# Patient Record
Sex: Male | Born: 1980 | Race: Black or African American | Hispanic: No | Marital: Single | Smoking: Never smoker
Health system: Southern US, Community
[De-identification: ages and names within clinical notes are randomized; demographics above are authoritative.]

## PROBLEM LIST (undated history)

## (undated) ENCOUNTER — Emergency Department (HOSPITAL_COMMUNITY): Admission: EM | Payer: BC Managed Care – PPO | Source: Home / Self Care

---
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
## 2002-02-11 ENCOUNTER — Emergency Department (HOSPITAL_COMMUNITY): Admission: EM | Admit: 2002-02-11 | Discharge: 2002-02-11 | Payer: Self-pay | Admitting: Emergency Medicine

## 2005-01-02 ENCOUNTER — Emergency Department (HOSPITAL_COMMUNITY): Admission: EM | Admit: 2005-01-02 | Discharge: 2005-01-02 | Payer: Self-pay | Admitting: Emergency Medicine

## 2005-10-25 ENCOUNTER — Emergency Department (HOSPITAL_COMMUNITY): Admission: EM | Admit: 2005-10-25 | Discharge: 2005-10-25 | Payer: Self-pay | Admitting: Family Medicine

## 2006-06-02 ENCOUNTER — Emergency Department (HOSPITAL_COMMUNITY): Admission: EM | Admit: 2006-06-02 | Discharge: 2006-06-02 | Payer: Self-pay | Admitting: Emergency Medicine

## 2007-10-20 ENCOUNTER — Emergency Department (HOSPITAL_COMMUNITY): Admission: EM | Admit: 2007-10-20 | Discharge: 2007-10-20 | Payer: Self-pay | Admitting: Emergency Medicine

## 2009-04-02 ENCOUNTER — Emergency Department (HOSPITAL_COMMUNITY): Admission: EM | Admit: 2009-04-02 | Discharge: 2009-04-02 | Payer: Self-pay | Admitting: Family Medicine

## 2010-11-24 ENCOUNTER — Inpatient Hospital Stay (INDEPENDENT_AMBULATORY_CARE_PROVIDER_SITE_OTHER)
Admission: RE | Admit: 2010-11-24 | Discharge: 2010-11-24 | Disposition: A | Discharge status: Home / Self Care | Payer: Self-pay | Source: Ambulatory Visit | Attending: Family Medicine | Admitting: Family Medicine

## 2010-11-24 ENCOUNTER — Ambulatory Visit (INDEPENDENT_AMBULATORY_CARE_PROVIDER_SITE_OTHER): Payer: Self-pay

## 2010-11-24 DIAGNOSIS — J45909 Unspecified asthma, uncomplicated: Secondary | ICD-10-CM

## 2011-02-17 LAB — POCT RAPID STREP A: Streptococcus, Group A Screen (Direct): NEGATIVE

## 2011-04-08 ENCOUNTER — Emergency Department (HOSPITAL_COMMUNITY)
Admission: EM | Admit: 2011-04-08 | Discharge: 2011-04-08 | Disposition: A | Discharge status: Home / Self Care | Payer: Self-pay | Source: Home / Self Care

## 2011-04-08 ENCOUNTER — Emergency Department (INDEPENDENT_AMBULATORY_CARE_PROVIDER_SITE_OTHER): Payer: Self-pay

## 2011-04-08 ENCOUNTER — Encounter: Payer: Self-pay | Admitting: Emergency Medicine

## 2011-04-08 DIAGNOSIS — S93409A Sprain of unspecified ligament of unspecified ankle, initial encounter: Secondary | ICD-10-CM

## 2011-04-08 MED ORDER — IBUPROFEN 800 MG PO TABS: 800.0000 mg | ORAL_TABLET | Freq: Three times a day (TID) | ORAL | Status: AC

## 2011-04-08 NOTE — ED Notes (Signed)
Onset of pain was Monday--patient was at gym, playing basketball.  Player stepped on foot, heard pop.  Tried to use a few other gym pieces, but too painful.  Swelling to ankle/foot/toes. Pain is under lateral ankle, right foot.

## 2011-04-08 NOTE — ED Provider Notes (Signed)
History     CSN: [id number] Arrival date & time: 04/08/2011  6:43 PM   First MD Initiated Contact with Patient 04/08/11 1946      Chief Complaint  Patient presents with  . Ankle Pain    (Consider location/radiation/quality/duration/timing/severity/associated sxs/prior treatment) Patient is a 30 y.o. male presenting with ankle pain. The history is provided by the patient.  Ankle Pain  The incident occurred more than 2 days ago (was playing basket twisted foot in inverted position able to bear weight after). The incident occurred at the gym. The injury mechanism was torsion. The pain is present in the right ankle. The pain is at a severity of 6/10. The pain is moderate. The pain has been constant since onset. Associated symptoms include loss of motion. Pertinent negatives include no numbness, no inability to bear weight, no muscle weakness, no loss of sensation and no tingling. The symptoms are aggravated by bearing weight and palpation. He has tried acetaminophen for the symptoms. The treatment provided mild relief.    History reviewed. No pertinent past medical history.  Past Surgical History  Procedure Date  . Anterior cruciate ligament repair     History reviewed. No pertinent family history.  History  Substance Use Topics  . Smoking status: Never Smoker   . Smokeless tobacco: Not on file  . Alcohol Use: No      Review of Systems  Constitutional: Negative.   Cardiovascular: Negative.   Musculoskeletal: Positive for arthralgias.  Skin: Negative.   Neurological: Negative for tingling and numbness.    Allergies  Morphine and related  Home Medications   Current Outpatient Rx  Name Route Sig Dispense Refill  . IBUPROFEN 800 MG PO TABS Oral Take 1 tablet (800 mg total) by mouth 3 (three) times daily. 21 tablet 0    Take with food    BP 134/84  Pulse 69  Temp(Src) 98.2 F (36.8 C) (Oral)  Resp 18  SpO2 100%  Physical Exam  Nursing note and vitals  reviewed. Constitutional: He is oriented to person, place, and time. He appears well-developed and well-nourished. No distress.  HENT:  Head: Normocephalic and atraumatic.  Neck: Normal range of motion.  Cardiovascular: Normal rate, regular rhythm and normal heart sounds.   Pulmonary/Chest: Breath sounds normal.  Musculoskeletal:       Right ankle: He exhibits swelling. He exhibits no ecchymosis, no deformity, no laceration and normal pulse. tenderness. Lateral malleolus tenderness found. No medial malleolus and no proximal fibula tenderness found. Achilles tendon normal.  Neurological: He is alert and oriented to person, place, and time.    ED Course  Procedures (including critical care time)  Labs Reviewed - No data to display Dg Foot Complete Right  04/08/2011  *RADIOLOGY REPORT*  Clinical Data: Right foot pain and swelling.  RIGHT FOOT COMPLETE - 3+ VIEW  Comparison: None  Findings: The joint spaces are maintained.  No acute fracture.  IMPRESSION: No acute bony findings.  Original Report Authenticated By: P. Loralie Champagne, M.D.     1. Ankle sprain       MDM  Placed ankle ASO brace,  RICE and antiinflammatory medication encouraged to start rehab exercises as soon as pain improves.Sharin Grave, MD 04/09/11 1226

## 2011-05-05 ENCOUNTER — Emergency Department (INDEPENDENT_AMBULATORY_CARE_PROVIDER_SITE_OTHER)
Admission: EM | Admit: 2011-05-05 | Discharge: 2011-05-05 | Disposition: A | Discharge status: Home / Self Care | Payer: Self-pay | Source: Home / Self Care | Attending: Family Medicine | Admitting: Family Medicine

## 2011-05-05 ENCOUNTER — Encounter (HOSPITAL_COMMUNITY): Payer: Self-pay

## 2011-05-05 DIAGNOSIS — H6980 Other specified disorders of Eustachian tube, unspecified ear: Secondary | ICD-10-CM

## 2011-05-05 DIAGNOSIS — H6990 Unspecified Eustachian tube disorder, unspecified ear: Secondary | ICD-10-CM

## 2011-05-05 MED ORDER — AMOXICILLIN 500 MG PO CAPS: 500.0000 mg | ORAL_CAPSULE | Freq: Three times a day (TID) | ORAL | Status: AC

## 2011-05-05 MED ORDER — FLUTICASONE PROPIONATE 50 MCG/ACT NA SUSP: 2.0000 | Freq: Every day | NASAL | Status: DC

## 2011-05-05 NOTE — ED Notes (Signed)
C/o sore throat, soreness to neck bilaterally and nasal congestion for 2 days.  Denies known fever or cough.

## 2011-05-05 NOTE — ED Provider Notes (Signed)
History     CSN: [id number] Arrival date & time: 05/05/2011  8:57 AM   First MD Initiated Contact with Patient 05/05/11 0913      Chief Complaint  Patient presents with  . Sore Throat    (Consider location/radiation/quality/duration/timing/severity/associated sxs/prior treatment) HPI Comments: Ledon presents for evaluation of pain beneath both ears, neck pain with turning his head. He denies any fever or cough. He does report a cracked tooth and has a dentist appointment in 2 weeks.   Patient is a 30 y.o. male presenting with pharyngitis. The history is provided by the patient.  Sore Throat This is a new problem. The current episode started more than 2 days ago. The problem has not changed since onset.   Past Medical History  Diagnosis Date  . Asthma     Past Surgical History  Procedure Date  . Anterior cruciate ligament repair     No family history on file.  History  Substance Use Topics  . Smoking status: Never Smoker   . Smokeless tobacco: Not on file  . Alcohol Use: No      Review of Systems  Constitutional: Negative.   HENT: Positive for congestion, sore throat and neck pain. Negative for ear pain, trouble swallowing and ear discharge.   Eyes: Negative.   Respiratory: Negative.   Cardiovascular: Negative.   Gastrointestinal: Negative.   Genitourinary: Negative.   Skin: Negative.   Neurological: Negative.     Allergies  Morphine and related  Home Medications   Current Outpatient Rx  Name Route Sig Dispense Refill  . AMOXICILLIN 500 MG PO CAPS Oral Take 1 capsule (500 mg total) by mouth 3 (three) times daily. 30 capsule 0  . FLUTICASONE PROPIONATE 50 MCG/ACT NA SUSP Nasal Place 2 sprays into the nose daily. 16 g 2    BP 127/85  Pulse 70  Temp(Src) 98.2 F (36.8 C) (Oral)  Resp 20  SpO2 100%  Physical Exam  Nursing note and vitals reviewed. Constitutional: He is oriented to person, place, and time. He appears well-developed and  well-nourished.  HENT:  Head: Normocephalic and atraumatic.  Right Ear: Tympanic membrane is retracted.  Left Ear: Tympanic membrane is retracted.  Mouth/Throat: Uvula is midline, oropharynx is clear and moist and mucous membranes are normal.  Eyes: EOM are normal.  Neck: Normal range of motion. Neck supple. Muscular tenderness present. No spinous process tenderness present. Normal range of motion present.    Pulmonary/Chest: Effort normal.  Musculoskeletal: Normal range of motion.  Neurological: He is alert and oriented to person, place, and time.  Skin: Skin is warm and dry.  Psychiatric: His behavior is normal.    ED Course  Procedures (including critical care time)   Labs Reviewed  POCT RAPID STREP A ([facility] ONLY)   No results found.   1. Eustachian tube dysfunction       MDM  Labs reviewed        Richardo Priest, MD 05/05/11 1030

## 2012-03-12 ENCOUNTER — Emergency Department (HOSPITAL_COMMUNITY)
Admission: EM | Admit: 2012-03-12 | Discharge: 2012-03-12 | Disposition: A | Discharge status: Home / Self Care | Payer: Self-pay | Attending: Emergency Medicine | Admitting: Emergency Medicine

## 2012-03-12 ENCOUNTER — Emergency Department (HOSPITAL_COMMUNITY): Payer: Self-pay

## 2012-03-12 DIAGNOSIS — T148XXA Other injury of unspecified body region, initial encounter: Secondary | ICD-10-CM | POA: Insufficient documentation

## 2012-03-12 DIAGNOSIS — Y939 Activity, unspecified: Secondary | ICD-10-CM | POA: Insufficient documentation

## 2012-03-12 DIAGNOSIS — X500XXA Overexertion from strenuous movement or load, initial encounter: Secondary | ICD-10-CM | POA: Insufficient documentation

## 2012-03-12 MED ORDER — KETOROLAC TROMETHAMINE 30 MG/ML IJ SOLN
30.0000 mg | Freq: Once | INTRAMUSCULAR | Status: AC
  Administered 2012-03-12: 30 mg via INTRAMUSCULAR
  Filled 2012-03-12: qty 1

## 2012-03-12 MED ORDER — IBUPROFEN 600 MG PO TABS: 600.0000 mg | ORAL_TABLET | Freq: Four times a day (QID) | ORAL | Status: DC | PRN

## 2012-03-12 NOTE — ED Provider Notes (Signed)
History     CSN: [id number]  Arrival date & time 03/12/12  2051   First MD Initiated Contact with Patient 03/12/12 2112      Chief Complaint  Patient presents with  . 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  On the right.  No numbness or tingling distally, full ROM  to the elbow, wrist , fingers   Patient is a 31 y.o. male presenting with shoulder injury. The history is provided by the patient.  Shoulder Injury This is a new problem. The current episode started today. The problem occurs constantly. The problem has been unchanged. Associated symptoms include joint swelling. Pertinent negatives include no chills, fever, numbness or weakness.    Past Medical History  Diagnosis Date  . Asthma     Past Surgical History  Procedure Date  . Anterior cruciate ligament repair     No family history on file.  History  Substance Use Topics  . Smoking status: Never Smoker   . Smokeless tobacco: Not on file  . Alcohol Use: No      Review of Systems  Constitutional: Negative for fever and chills.  Musculoskeletal: Positive for joint swelling.  Neurological: Negative for dizziness, weakness and numbness.    Allergies  Morphine and related  Home Medications   Current Outpatient Rx  Name Route Sig Dispense Refill  . IBUPROFEN 200 MG PO TABS Oral Take 800 mg by mouth every 6 (six) hours as needed. pain    . IBUPROFEN 600 MG PO TABS Oral Take 1 tablet (600 mg total) by mouth every 6 (six) hours as needed for pain. 30 tablet 0    BP 136/93  Pulse 54  Temp 98.4 F (36.9 C) (Oral)  Resp 17  SpO2 100%  Physical Exam  Constitutional: He is oriented to person, place,  and time. He appears well-developed and well-nourished.  Neck: Normal range of motion.  Cardiovascular: Normal rate.   Pulmonary/Chest: Effort normal.  Musculoskeletal: He exhibits tenderness. He exhibits no edema.       Arms: Neurological: He is alert and oriented to person, place, and time.  Skin: Skin is warm. No erythema.    ED Course  Procedures (including critical care time)  Labs Reviewed - No data to display Dg Shoulder Right  03/12/2012  *RADIOLOGY REPORT*  Clinical Data: Right shoulder pain.  Limited range of motion.  RIGHT SHOULDER - 2+ VIEW  Comparison: None.  Findings: There is no fracture, dislocation, or other abnormality.  IMPRESSION: Normal exam.   Original Report Authenticated By: Gwynn Burly, M.D.      1. Muscle strain       MDM   Severe.  Muscle strain.  Will limit physical activity.  For one week and gradually add back to sit-ups and flexibility.  Over the following 2 weeks as pain allows.  Referred to orthopedics for further evaluation.  If pain, and limited mobility persists        Arman Filter, NP 03/12/12 2201  Arman Filter, NP 03/12/12 6962  Arman Filter, NP 03/12/12 2201

## 2012-03-12 NOTE — ED Notes (Signed)
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 Pt states ROM intact, but painful. Pt states he has been taking Motrin, but it's not helping the pain.

## 2012-03-13 NOTE — ED Provider Notes (Signed)
Medical screening examination/treatment/procedure(s) were conducted as a shared visit with non-physician practitioner(s) and myself.  I personally evaluated the patient during the encounter. Shoulder pain during training for police. Reassuring x-ray. Patient discharged home.  Juliet Rude. Rubin Payor, MD 03/13/12 4098

## 2013-07-29 ENCOUNTER — Emergency Department (HOSPITAL_COMMUNITY)
Admission: EM | Admit: 2013-07-29 | Discharge: 2013-07-29 | Disposition: A | Discharge status: Home / Self Care | Payer: BC Managed Care – PPO | Source: Home / Self Care | Attending: Emergency Medicine | Admitting: Emergency Medicine

## 2013-07-29 ENCOUNTER — Encounter (HOSPITAL_COMMUNITY): Payer: Self-pay | Admitting: Emergency Medicine

## 2013-07-29 DIAGNOSIS — R69 Illness, unspecified: Principal | ICD-10-CM

## 2013-07-29 DIAGNOSIS — J111 Influenza due to unidentified influenza virus with other respiratory manifestations: Secondary | ICD-10-CM

## 2013-07-29 MED ORDER — OSELTAMIVIR PHOSPHATE 75 MG PO CAPS: 75.0000 mg | ORAL_CAPSULE | Freq: Two times a day (BID) | ORAL | Status: DC

## 2013-07-29 NOTE — Discharge Instructions (Signed)
Most upper respiratory infections are caused by viruses and do not require antibiotics.  We try to save the antibiotics for when we really need them to prevent bacteria from developing resistance to them.  Here are a few hints about things that can be done at home to help get over an upper respiratory infection quicker: ° °Get extra sleep and extra fluids.  Get 7 to 9 hours of sleep per night and 6 to 8 glasses of water a day.  Getting extra sleep keeps the immune system from getting run down.  Most people with an upper respiratory infection are a little dehydrated.  The extra fluids also keep the secretions liquified and easier to deal with.  Also, get extra vitamin C.  4000 mg per day is the recommended dose. °For the aches, headache, and fever, acetaminophen or ibuprofen are helpful.  These can be alternated every 4 hours.  People with liver disease should avoid large amounts of acetaminophen, and people with ulcer disease, gastroesophageal reflux, gastritis, congestive heart failure, chronic kidney disease, coronary artery disease and the elderly should avoid ibuprofen. °For nasal congestion try Mucinex-D, or if you're having lots of sneezing or clear nasal drainage use Zyrtec-D. People with high blood pressure can take these if their blood pressure is controlled, if not, it's best to avoid the forms with a "D" (decongestants).  You can use the plain Mucinex, Allegra, Claritin, or Zyrtec even if your blood pressure is not controlled.   °A Saline nasal spray such as Ocean Spray can also help.  You can add a decongestant sprays such as Afrin, but you should not use the decongestant sprays for more than 3 or 4 days since they can be habituating.  Breathe Rite nasal strips can also offer a non-drug alternative treatment to nasal congestion, especially at night. °For people with symptoms of sinusitis, sleeping with your head elevated can be helpful.  For sinus pain, moist, hot compresses to the face may provide some  relief.  Many people find that inhaling steam as in a shower or from a pot of steaming water can help. °For any viral infection, zinc containing lozenges such as Cold-Eze or Zicam are helpful.  Zinc helps to fight viral infection.  Hot salt water gargles (8 oz of hot water, 1/2 tsp of table salt, and a pinch of baking soda) can give relief as well as hot beverages such as hot tea.  Sucrets extra strength lozenges will help the sore throat.  °For the cough, take Delsym 2 tsp every 12 hours.  It has also been found recently that Aleve can help control a cough.  The dose is 1 to 2 tablets twice daily with food.  This can be combined with Delsym. (Note, if you are taking ibuprofen, you should not take Aleve as well--take one or the other.) °A cool mist vaporizer will help keep your mucous membranes from drying out.  ° °It's important when you have an upper respiratory infection not to pass the infection to others.  This involves being very careful about the following: ° °Frequent hand washing or use of hand sanitizer, especially after coughing, sneezing, blowing your nose or touching your face, nose or eyes. °Do not shake hands or touch anyone and try to avoid touching surfaces that other people use such as doorknobs, shopping carts, telephones and computer keyboards. °Use tissues and dispose of them properly in a garbage can or ziplock bag. °Cough into your sleeve. °Do not let others eat or   drink after you. ° °It's also important to recognize the signs of serious illness and get evaluated if they occur: °Any respiratory infection that lasts more than 7 to 10 days.  Yellow nasal drainage and sputum are not reliable indicators of a bacterial infection, but if they last for more than 1 week, see your doctor. °Fever and sore throat can indicate strep. °Fever and cough can indicate influenza or pneumonia. °Any kind of severe symptom such as difficulty breathing, intractable vomiting, or severe pain should prompt you to see  a doctor as soon as possible. ° ° °Your body's immune system is really the thing that will get rid of this infection.  Your immune system is comprised of 2 types of specialized cells called T cells and B cells.  T cells coordinate the array of cells in your body that engulf invading bacteria or viruses while B cells orchestrate the production of antibodies that neutralize infection.  Anything we do or any medications we give you, will just strengthen your immune system or help it clear up the infection quicker.  Here are a few helpful hints to improve your immune system to help overcome this illness or to prevent future infections: °· A few vitamins can improve the health of your immune system.  That's why your diet should include plenty of fruits, vegetables, fish, nuts, and whole grains. °· Vitamin A and bet-carotene can increase the cells that fight infections (T cells and B cells).  Vitamin A is abundant in dark greens and orange vegetables such as spinach, greens, sweet potatoes, and carrots. °· Vitamin B6 contributes to the maturation of white blood cells, the cells that fight disease.  Foods with vitamin B6 include cold cereal and bananas. °· Vitamin C is credited with preventing colds because it increases white blood cells and also prevents cellular damage.  Citrus fruits, peaches and green and red bell peppers are all hight in vitamin C. °· Vitamin E is an anti-oxidant that encourages the production of natural killer cells which reject foreign invaders and B cells that produce antibodies.  Foods high in vitamin E include wheat germ, nuts and seeds. °· Foods high in omega-3 fatty acids found in foods like salmon, tuna and mackerel boost your immune system and help cells to engulf and absorb germs. °· Probiotics are good bacteria that increase your T cells.  These can be found in yogurt and are available in supplements such as Culturelle or Align. °· Moderate exercise increases the strength of your immune  system and your ability to recover from illness.  I suggest 3 to 5 moderate intensity 30 minute workouts per week.   °· Sleep is another component of maintaining a strong immune system.  It enables your body to recuperate from the day's activities, stress and work.  My recommendation is to get between 7 and 9 hours of sleep per night. °· If you smoke, try to quit completely or at least cut down.  Drink alcohol only in moderation if at all.  No more than 2 drinks daily for men or 1 for women. °· Get a flu vaccine early in the fall or if you have not gotten one yet, once this illness has run its course.  If you are over 65, a smoker, or an asthmatic, get a pneumococcal vaccine. °· My final recommendation is to maintain a healthy weight.  Excess weight can impair the immune system by interfering with the way the immune system deals with invading viruses or   bacteria. ° ° ° °Influenza, Adult °Influenza ("the flu") is a viral infection of the respiratory tract. It occurs more often in winter months because people spend more time in close contact with one another. Influenza can make you feel very sick. Influenza easily spreads from person to person (contagious). °CAUSES  °Influenza is caused by a virus that infects the respiratory tract. You can catch the virus by breathing in droplets from an infected person's cough or sneeze. You can also catch the virus by touching something that was recently contaminated with the virus and then touching your mouth, nose, or eyes. °SYMPTOMS  °Symptoms typically last 4 to 10 days and may include: °· Fever. °· Chills. °· Headache, body aches, and muscle aches. °· Sore throat. °· Chest discomfort and cough. °· Poor appetite. °· Weakness or feeling tired. °· Dizziness. °· Nausea or vomiting. °DIAGNOSIS  °Diagnosis of influenza is often made based on your history and a physical exam. A nose or throat swab test can be done to confirm the diagnosis. °RISKS AND COMPLICATIONS °You may be at risk  for a more severe case of influenza if you smoke cigarettes, have diabetes, have chronic heart disease (such as heart failure) or lung disease (such as asthma), or if you have a weakened immune system. Elderly people and pregnant women are also at risk for more serious infections. The most common complication of influenza is a lung infection (pneumonia). Sometimes, this complication can require emergency medical care and may be life-threatening. °PREVENTION  °An annual influenza vaccination (flu shot) is the best way to avoid getting influenza. An annual flu shot is now routinely recommended for all adults in the [place] °TREATMENT  °In mild cases, influenza goes away on its own. Treatment is directed at relieving symptoms. For more severe cases, your caregiver may prescribe antiviral medicines to shorten the sickness. Antibiotic medicines are not effective, because the infection is caused by a virus, not by bacteria. °HOME CARE INSTRUCTIONS °· Only take over-the-counter or prescription medicines for pain, discomfort, or fever as directed by your caregiver. °· Use a cool mist humidifier to make breathing easier. °· Get plenty of rest until your temperature returns to normal. This usually takes 3 to 4 days. °· Drink enough fluids to keep your urine clear or pale yellow. °· Cover your mouth and nose when coughing or sneezing, and wash your hands well to avoid spreading the virus. °· Stay home from work or school until your fever has been gone for at least 1 full day. °SEEK MEDICAL CARE IF:  °· You have chest pain or a deep cough that worsens or produces more mucus. °· You have nausea, vomiting, or diarrhea. °SEEK IMMEDIATE MEDICAL CARE IF:  °· You have difficulty breathing, shortness of breath, or your skin or nails turn bluish. °· You have severe neck pain or stiffness. °· You have a severe headache, facial pain, or earache. °· You have a worsening or recurring fever. °· You have nausea or vomiting that cannot be  controlled. °MAKE SURE YOU: °· Understand these instructions. °· Will watch your condition. °· Will get help right away if you are not doing well or get worse. °Document Released: 05/07/2000 Document Revised: 11/09/2011 Document Reviewed: 08/09/2011 °[employer]® Patient Information ©2014 [employer]. ° °

## 2013-07-29 NOTE — ED Notes (Signed)
C/o URI type symptoms ; minimal relief w OTC medications; NAD

## 2013-07-29 NOTE — ED Provider Notes (Signed)
  Chief Complaint   Chief Complaint  Patient presents with  . URI    History of Present Illness   Jeffery Jordan is a 33 year old male who has had a three-day history of temperature of up to 100.1, sweats, fatigue, nasal congestion with clear drainage, headache, sinus pressure, right ear congestion, sore throat, and a cough productive of small amounts of sputum. He denies any wheezing, chest pain, or GI symptoms. He's been exposed to his wife who has had a similar respiratory illness and she is a Chart[employer] loss adjusterschoolteacher.  Review of Systems   Other than as noted above, the patient denies any of the following symptoms: Systemic:  No fevers, chills, sweats, or myalgias. Eye:  No redness or discharge. ENT:  No ear pain, headache, nasal congestion, drainage, sinus pressure, or sore throat. Neck:  No neck pain, stiffness, or swollen glands. Lungs:  No cough, sputum production, hemoptysis, wheezing, chest tightness, shortness of breath or chest pain. GI:  No abdominal pain, nausea, vomiting or diarrhea.  PMFSH   Past medical history, family history, social history, meds, and allergies were reviewed.   Physical exam   Vital signs:  BP 148/95  Pulse 50  Temp(Src) 98.1 F (36.7 C)  Resp 18  SpO2 100% General:  Alert and oriented.  In no distress.  Skin warm and dry. Eye:  No conjunctival injection or drainage. Lids were normal. ENT:  TMs and canals were normal, without erythema or inflammation.  Nasal mucosa was clear and uncongested, without drainage.  Mucous membranes were moist.  Pharynx was clear with no exudate or drainage.  There were no oral ulcerations or lesions. Neck:  Supple, no adenopathy, tenderness or mass. Lungs:  No respiratory distress.  Lungs were clear to auscultation, without wheezes, rales or rhonchi.  Breath sounds were clear and equal bilaterally.  Heart:  Regular rhythm, without gallops, murmers or rubs. Skin:  Clear, warm, and dry, without rash or lesions.  Labs    Results for orders placed during the hospital encounter of 05/05/11  POCT RAPID STREP A ([facility] ONLY)      Result Value Ref Range   Streptococcus, Group A Screen (Direct) NEGATIVE  NEGATIVE    Assessment     The encounter diagnosis was Influenza-like illness.  Plan    1.  Meds:  The following meds were prescribed:   New Prescriptions   OSELTAMIVIR (TAMIFLU) 75 MG CAPSULE    Take 1 capsule (75 mg total) by mouth every 12 (twelve) hours.    2.  Patient Education/Counseling:  The patient was given appropriate handouts, self care instructions, and instructed in symptomatic relief.  Instructed to get extra fluids, rest, and use a cool mist vaporizer.    3.  Follow up:  The patient was told to follow up here if no better in 3 to 4 days, or sooner if becoming worse in any way, and given some red flag symptoms such as increasing fever, difficulty breathing, chest pain, or persistent vomiting which would prompt immediate return.  Follow up here as needed.      Reuben Likesavid C Melek Pownall, MD 07/29/13 1136

## 2013-11-16 ENCOUNTER — Emergency Department (HOSPITAL_COMMUNITY)
Admission: EM | Admit: 2013-11-16 | Discharge: 2013-11-16 | Disposition: A | Discharge status: Home / Self Care | Payer: BC Managed Care – PPO | Attending: Emergency Medicine | Admitting: Emergency Medicine

## 2013-11-16 ENCOUNTER — Encounter (HOSPITAL_COMMUNITY): Payer: Self-pay | Admitting: Emergency Medicine

## 2013-11-16 DIAGNOSIS — J45909 Unspecified asthma, uncomplicated: Secondary | ICD-10-CM | POA: Insufficient documentation

## 2013-11-16 DIAGNOSIS — J069 Acute upper respiratory infection, unspecified: Secondary | ICD-10-CM

## 2013-11-16 DIAGNOSIS — R509 Fever, unspecified: Secondary | ICD-10-CM | POA: Insufficient documentation

## 2013-11-16 DIAGNOSIS — Z79899 Other long term (current) drug therapy: Secondary | ICD-10-CM | POA: Insufficient documentation

## 2013-11-16 NOTE — ED Notes (Signed)
PA at bedside.

## 2013-11-16 NOTE — ED Notes (Addendum)
Patient c/o fever (100.1) at home yesterday, has not been feeling well the past couple days. Patient states he has not been able to sleep due to the sweats. Patient states he had R lymphnode swelling, and runny nose.

## 2013-11-16 NOTE — Discharge Instructions (Signed)
Continue to take Tylenol or Ibuprofen for any headache or fever.  Follow up with a primary care provider for continued evaluation and treatment.    Upper Respiratory Infection, Adult An upper respiratory infection (URI) is also known as the common cold. It is often caused by a type of germ (virus). Colds are easily spread (contagious). You can pass it to others by kissing, coughing, sneezing, or drinking out of the same glass. Usually, you get better in 1 or 2 weeks.  HOME CARE   Only take medicine as told by your doctor.  Use a warm mist humidifier or breathe in steam from a hot shower.  Drink enough water and fluids to keep your pee (urine) clear or pale yellow.  Get plenty of rest.  Return to work when your temperature is back to normal or as told by your doctor. You may use a face mask and wash your hands to stop your cold from spreading. GET HELP RIGHT AWAY IF:   After the first few days, you feel you are getting worse.  You have questions about your medicine.  You have chills, shortness of breath, or brown or red spit (mucus).  You have yellow or brown snot (nasal discharge) or pain in the face, especially when you bend forward.  You have a fever, puffy (swollen) neck, pain when you swallow, or white spots in the back of your throat.  You have a bad headache, ear pain, sinus pain, or chest pain.  You have a high-pitched whistling sound when you breathe in and out (wheezing).  You have a lasting cough or cough up blood.  You have sore muscles or a stiff neck. MAKE SURE YOU:   Understand these instructions.  Will watch your condition.  Will get help right away if you are not doing well or get worse. Document Released: 10/27/2007 Document Revised: 08/02/2011 Document Reviewed: 09/14/2010 [facility] Patient Information 2015 [place], [place]. This information is not intended to replace advice given to you by your health care provider. Make sure you discuss any questions you  have with your health care provider.

## 2013-11-16 NOTE — ED Provider Notes (Signed)
CSN: [id number]     Arrival date & time 11/16/13  [id number] History   First MD Initiated Contact with Patient 11/16/13 0505     Chief Complaint  Patient presents with  . Fever  . Nasal Congestion   HPI  History provided by the patient. Patient is a 33 year old male with past history of asthma presents with concerns for possible infection with symptoms of fever, cough and congestion. He reports having 3-4 days of cough and congestion. He also reports having a fever of 100 yesterday. Last night he broke out into sweats but states he has been feeling much better today. He states he thinks maybe he is improving from infection. Denies any known sick contacts. 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 Denies any chest pain or shortness of breath. No episodes of vomiting or diarrhea. No other aggravating or alleviating factors. No other associated symptoms.   Past Medical History  Diagnosis Date  . Asthma    Past Surgical History  Procedure Laterality Date  . Anterior cruciate ligament repair     No family history on file. History  Substance Use Topics  . Smoking status: Never Smoker   . Smokeless tobacco: Not on file  . Alcohol Use: No    Review of Systems  Constitutional: Positive for fever, chills and diaphoresis. Negative for appetite change.  HENT: Positive for congestion and rhinorrhea. Negative for sore throat.   Respiratory: Positive for cough.   Gastrointestinal: Negative for vomiting, abdominal pain and diarrhea.  All other systems reviewed and are negative.     Allergies  Morphine and related  Home Medications   Prior to Admission medications   Medication Sig Start Date End Date Taking? Authorizing Provider  ibuprofen (ADVIL,MOTRIN) 200 MG tablet Take 800 mg by mouth every 6 (six) hours as needed. pain    Historical Provider, MD  ibuprofen (ADVIL,MOTRIN) 600 MG tablet Take 1 tablet (600 mg total) by mouth every 6 (six) hours  as needed for pain. 03/12/12   Arman FilterGail K Schulz, NP  oseltamivir (TAMIFLU) 75 MG capsule Take 1 capsule (75 mg total) by mouth every 12 (twelve) hours. 07/29/13   Reuben Likesavid C Keller, MD   BP 134/104  Pulse 72  Temp(Src) 98.2 F (36.8 C) (Oral)  Resp 16  SpO2 100% Physical Exam  Nursing note and vitals reviewed. Constitutional: He is oriented to person, place, and time. He appears well-developed and well-nourished. No distress.  HENT:  Head: Normocephalic and atraumatic.  Right Ear: Tympanic membrane normal.  Left Ear: Tympanic membrane normal.  Mouth/Throat: Oropharynx is clear and moist.  Eyes: Conjunctivae are normal.  Neck: Normal range of motion. Neck supple.  No meningeal signs  Cardiovascular: Normal rate and regular rhythm.   Pulmonary/Chest: Effort normal and breath sounds normal. No respiratory distress. He has no wheezes. He has no rales.  Abdominal: Soft. There is no tenderness.  Neurological: He is alert and oriented to person, place, and time.  Skin: Skin is warm.  Psychiatric: He has a normal mood and affect. His behavior is normal.    ED Course  Procedures   COORDINATION OF CARE:  Nursing notes reviewed. Vital signs reviewed. Initial pt interview and examination performed.   Filed Vitals:   11/16/13 0347  BP: 134/104  Pulse: 72  Temp: 98.2 F (36.8 C)  TempSrc: Oral  Resp: 16  SpO2: 100%    5:05 AM-patient seen and evaluated. Patient is well-appearing in  no acute distress. He is afebrile here. His appears irregular toxic. No significant findings on examination. He does report some recent symptoms consistent with URI now seems to be improving. At this time I will recommend any over-the-counter treatment for any symptoms bothering him. He agrees with this plan.      MDM   Final diagnoses:  URI (upper respiratory infection)        Angus Sellereter S Imara Standiford, PA-C 11/16/13 [phone]

## 2013-11-18 NOTE — ED Provider Notes (Signed)
Medical screening examination/treatment/procedure(s) were performed by non-physician practitioner and as supervising physician I was immediately available for consultation/collaboration.   Candyce ChurnJohn David Wofford III, MD 11/18/13 [id number]

## 2014-01-22 HISTORY — PX: FOOT SURGERY: SHX648

## 2014-02-06 ENCOUNTER — Emergency Department (HOSPITAL_COMMUNITY)
Admission: EM | Admit: 2014-02-06 | Discharge: 2014-02-07 | Disposition: A | Discharge status: Home / Self Care | Payer: BC Managed Care – PPO | Attending: Emergency Medicine | Admitting: Emergency Medicine

## 2014-02-06 ENCOUNTER — Encounter (HOSPITAL_COMMUNITY): Payer: Self-pay | Admitting: Emergency Medicine

## 2014-02-06 ENCOUNTER — Emergency Department (HOSPITAL_COMMUNITY): Payer: BC Managed Care – PPO

## 2014-02-06 DIAGNOSIS — S91109A Unspecified open wound of unspecified toe(s) without damage to nail, initial encounter: Secondary | ICD-10-CM | POA: Insufficient documentation

## 2014-02-06 DIAGNOSIS — Z79899 Other long term (current) drug therapy: Secondary | ICD-10-CM | POA: Insufficient documentation

## 2014-02-06 DIAGNOSIS — S91309A Unspecified open wound, unspecified foot, initial encounter: Secondary | ICD-10-CM | POA: Diagnosis present

## 2014-02-06 DIAGNOSIS — J45909 Unspecified asthma, uncomplicated: Secondary | ICD-10-CM | POA: Diagnosis not present

## 2014-02-06 DIAGNOSIS — Y9389 Activity, other specified: Secondary | ICD-10-CM | POA: Diagnosis not present

## 2014-02-06 DIAGNOSIS — Z23 Encounter for immunization: Secondary | ICD-10-CM | POA: Diagnosis not present

## 2014-02-06 DIAGNOSIS — S91311A Laceration without foreign body, right foot, initial encounter: Secondary | ICD-10-CM

## 2014-02-06 DIAGNOSIS — W292XXA Contact with other powered household machinery, initial encounter: Secondary | ICD-10-CM | POA: Insufficient documentation

## 2014-02-06 DIAGNOSIS — Y9289 Other specified places as the place of occurrence of the external cause: Secondary | ICD-10-CM | POA: Insufficient documentation

## 2014-02-06 LAB — CBC WITH DIFFERENTIAL/PLATELET
Basophils Absolute: 0 10*3/uL (ref 0.0–0.1)
Basophils Relative: 0 % (ref 0–1)
EOS PCT: 2 % (ref 0–5)
Eosinophils Absolute: 0.1 10*3/uL (ref 0.0–0.7)
HEMATOCRIT: 39.8 % (ref 39.0–52.0)
Hemoglobin: 13.8 g/dL (ref 13.0–17.0)
LYMPHS ABS: 2.7 10*3/uL (ref 0.7–4.0)
LYMPHS PCT: 41 % (ref 12–46)
MCH: 31.1 pg (ref 26.0–34.0)
MCHC: 34.7 g/dL (ref 30.0–36.0)
MCV: 89.6 fL (ref 78.0–100.0)
Monocytes Absolute: 0.5 10*3/uL (ref 0.1–1.0)
Monocytes Relative: 8 % (ref 3–12)
NEUTROS ABS: 3.3 10*3/uL (ref 1.7–7.7)
Neutrophils Relative %: 49 % (ref 43–77)
PLATELETS: 232 10*3/uL (ref 150–400)
RBC: 4.44 MIL/uL (ref 4.22–5.81)
RDW: 12.8 % (ref 11.5–15.5)
WBC: 6.6 10*3/uL (ref 4.0–10.5)

## 2014-02-06 MED ORDER — MORPHINE SULFATE 4 MG/ML IJ SOLN
4.0000 mg | Freq: Once | INTRAMUSCULAR | Status: AC
  Administered 2014-02-06: 4 mg via INTRAVENOUS
  Filled 2014-02-06: qty 1

## 2014-02-06 NOTE — ED Provider Notes (Signed)
CSN: [id number]     Arrival date & time 02/06/14  2247 History   First MD Initiated Contact with Patient 02/06/14 2259     No chief complaint on file.    (Consider location/radiation/quality/duration/timing/severity/associated sxs/prior Treatment) HPI Comments: The patient is a 33 year old male presents emergency room chief complaint of laceration to right foot which occurred just prior to arrival. The patient reports he stepped on a food processor blade. Reports tetanus  > 5 years ago. Patient reports a large amount of bleeding into a trash can, multiple clots.  Patient is a 33 y.o. male presenting with skin laceration. The history is provided by the patient. No language interpreter was used.  Laceration   Past Medical History  Diagnosis Date  . Asthma    Past Surgical History  Procedure Laterality Date  . Anterior cruciate ligament repair     No family history on file. History  Substance Use Topics  . Smoking status: Never Smoker   . Smokeless tobacco: Not on file  . Alcohol Use: No    Review of Systems  Musculoskeletal: Positive for gait problem.  Skin: Positive for wound.  Neurological: Negative for numbness.      Allergies  Morphine and related  Home Medications   Prior to Admission medications   Medication Sig Start Date End Date Taking? Authorizing Provider  ibuprofen (ADVIL,MOTRIN) 200 MG tablet Take 800 mg by mouth every 6 (six) hours as needed. pain    Historical Provider, MD  ibuprofen (ADVIL,MOTRIN) 600 MG tablet Take 1 tablet (600 mg total) by mouth every 6 (six) hours as needed for pain. 03/12/12   Arman Filter, NP  oseltamivir (TAMIFLU) 75 MG capsule Take 1 capsule (75 mg total) by mouth every 12 (twelve) hours. 07/29/13   Reuben Likes, MD   BP 131/90  Pulse 61  Temp(Src) 98 F (36.7 C) (Oral)  Resp 20  SpO2 100% Physical Exam  Nursing note and vitals reviewed. Constitutional: He appears well-developed and well-nourished. No distress.  HENT:   Head: Normocephalic and atraumatic.  Neck: Neck supple.  Pulmonary/Chest: Effort normal. No respiratory distress.  Musculoskeletal:  Approximately 8 cm laceration between great toe and 2nd toe.  Extending from webbing to the sole of foot.  Moderate amount of blood, no pulsatile bleeding. Sensation intact, full ROM to toes. Right great toe with 2 CM circular laceration with small pedicle. Minimal bleeding. Sensation intact.  Skin: Skin is warm and dry. He is not diaphoretic.  Psychiatric: He has a normal mood and affect. His behavior is normal.    ED Course  LACERATION REPAIR Date/Time: 02/07/2014 12:18 AM Performed by: Mellody Drown Authorized by: Mellody Drown Consent: Verbal consent obtained. Risks and benefits: risks, benefits and alternatives were discussed Consent given by: patient Patient understanding: patient states understanding of the procedure being performed Patient consent: the patient's understanding of the procedure matches consent given Procedure consent: procedure consent matches procedure scheduled Required items: required blood products, implants, devices, and special equipment available Patient identity confirmed: verbally with patient Time out: Immediately prior to procedure a "time out" was called to verify the correct patient, procedure, equipment, support staff and site/side marked as required. Body area: lower extremity Location details: right foot Laceration length: 8 cm Foreign bodies: no foreign bodies Vascular damage: yes (Small pulsitile bleeding, no bleeding after suturing.) Anesthesia: local infiltration Local anesthetic: lidocaine 2% with epinephrine, lidocaine 2% without epinephrine and LET (lido,epi,tetracaine) Anesthetic total: 15 ml Patient sedated: no Preparation: Patient was prepped  and draped in the usual sterile fashion. Debridement: none Degree of undermining: none Skin closure: 4-0 nylon Number of sutures: 7 Technique:  simple Approximation: close Approximation difficulty: simple Dressing: splint and 4x4 sterile gauze Patient tolerance: Patient tolerated the procedure well with no immediate complications.  LACERATION REPAIR Date/Time: 02/07/2014 12:18 AM Performed by: Mellody Drown Authorized by: Mellody Drown Consent: Verbal consent obtained. Risks and benefits: risks, benefits and alternatives were discussed Consent given by: patient Patient understanding: patient states understanding of the procedure being performed Patient consent: the patient's understanding of the procedure matches consent given Procedure consent: procedure consent matches procedure scheduled Required items: required blood products, implants, devices, and special equipment available Patient identity confirmed: verbally with patient Time out: Immediately prior to procedure a "time out" was called to verify the correct patient, procedure, equipment, support staff and site/side marked as required. Body area: lower extremity Location details: right great toe Laceration length: 2 cm Foreign bodies: no foreign bodies Vascular damage: yes (Small pulsitile bleeding, no bleeding after suturing.) Anesthesia: local infiltration Local anesthetic: lidocaine 2% with epinephrine, lidocaine 2% without epinephrine and LET (lido,epi,tetracaine) Anesthetic total: 2 ml Patient sedated: no Preparation: Patient was prepped and draped in the usual sterile fashion. Debridement: none Degree of undermining: none Skin closure: 4-0 prolene Number of sutures: 3 Technique: simple Approximation: close Approximation difficulty: simple Dressing: splint and 4x4 sterile gauze Patient tolerance: Patient tolerated the procedure well with no immediate complications.   (including critical care time) Labs Review Labs Reviewed  CBC WITH DIFFERENTIAL    Imaging Review Dg Foot Complete Right  02/07/2014   CLINICAL DATA:  Injury to right foot from food  processor blades while stomping on spider.  EXAM: RIGHT FOOT COMPLETE - 3+ VIEW  COMPARISON:  Right foot radiographs performed 04/08/2011  FINDINGS: There is no evidence of fracture or dislocation. The joint spaces are preserved. There is no evidence of talar subluxation; the subtalar joint is unremarkable in appearance.  Soft tissue swelling is noted at the plantar forefoot, with a dressing about the toes and associated quick clot bandage artifact.  IMPRESSION: No evidence of osseous disruption.   Electronically Signed   By: Roanna Raider M.D.   On: 02/07/2014 00:13     EKG Interpretation None      MDM   Final diagnoses:  Laceration of right foot, initial encounter   Patient presents with laceration to right foot with a moderate amount of bleeding, pressure dressing applied. Bleeding controlled will obtain x-rays and suture. X-rays negative CBC without signs of anemia. Patient tolerated laceration repair well. Will discharge with postop shoe, ortho followup, crutches, wound care. TD updated. Discussed lab results, imaging results, and treatment plan with the patient. Return precautions given. Reports understanding and no other concerns at this time.  Patient is stable for discharge at this time. Meds given in ED:  Medications  morphine 4 MG/ML injection 4 mg (4 mg Intravenous Given 02/06/14 2318)  HYDROmorphone (DILAUDID) injection 0.5 mg (0.5 mg Intravenous Given 02/07/14 0015)  diphenhydrAMINE (BENADRYL) injection 25 mg (25 mg Intravenous Given 02/07/14 0015)  lidocaine-EPINEPHrine (XYLOCAINE W/EPI) 1 %-1:100000 (with pres) injection 20 mL (10 mLs Infiltration Given 02/07/14 0235)  lidocaine-EPINEPHrine-tetracaine (LET) solution (3 mLs Topical Given 02/07/14 0235)  lidocaine (XYLOCAINE) 2 % (with pres) injection 400 mg (80 mg Intradermal Given 02/07/14 0234)  Tdap (BOOSTRIX) injection 0.5 mL (0.5 mLs Intramuscular Given 02/07/14 0236)    Discharge Medication List as of 02/07/2014  1:54 AM  START taking these medications   Details  HYDROcodone-acetaminophen (NORCO/VICODIN) 5-325 MG per tablet Take 1 tablet by mouth every 4 (four) hours as needed., Starting 02/07/2014, Until Discontinued, Print    ibuprofen (ADVIL,MOTRIN) 800 MG tablet Take 1 tablet (800 mg total) by mouth 3 (three) times daily with meals., Starting 02/07/2014, Until Discontinued, Print          Mellody Drown, PA-C 02/07/14 1930

## 2014-02-06 NOTE — ED Notes (Signed)
Saturated bandage noted to R foot with clots noted in end of bandage. PA at bedside, foot unwrapped, clots removed, moderate bleeding noted, quick clot gauze and pressure dressing applied, IV est and pain medication given. Parents at bedside. Pt appears very anxious at this time. Pt very concerned about wife and 52 month old baby at home and returning to work in the AM. Plan of care explained to patient and family

## 2014-02-06 NOTE — ED Notes (Signed)
Pt states he awoke from sleep by wife and alerted to spider on the wall, pt states he jumped attempting to kill spider landing on food processing blade on the floor. Pt immediately noted pain to R foot, large laceration with heavy bleeding noted between R great toe and second toe, +CMS with good pulse.

## 2014-02-07 MED ORDER — LIDOCAINE HCL 2 % IJ SOLN
20.0000 mL | Freq: Once | INTRAMUSCULAR | Status: AC
  Administered 2014-02-07: 80 mg via INTRADERMAL
  Filled 2014-02-07: qty 20

## 2014-02-07 MED ORDER — HYDROMORPHONE HCL 1 MG/ML IJ SOLN
0.5000 mg | Freq: Once | INTRAMUSCULAR | Status: AC
Start: 2014-02-07 — End: 2014-02-07
  Administered 2014-02-07: 0.5 mg via INTRAVENOUS
  Filled 2014-02-07: qty 1

## 2014-02-07 MED ORDER — HYDROCODONE-ACETAMINOPHEN 5-325 MG PO TABS: 1.0000 | ORAL_TABLET | ORAL | Status: DC | PRN

## 2014-02-07 MED ORDER — TETANUS-DIPHTH-ACELL PERTUSSIS 5-2.5-18.5 LF-MCG/0.5 IM SUSP
0.5000 mL | Freq: Once | INTRAMUSCULAR | Status: AC
  Administered 2014-02-07: 0.5 mL via INTRAMUSCULAR
  Filled 2014-02-07: qty 0.5

## 2014-02-07 MED ORDER — LIDOCAINE-EPINEPHRINE-TETRACAINE (LET) SOLUTION
3.0000 mL | Freq: Once | NASAL | Status: AC
  Administered 2014-02-07: 3 mL via TOPICAL
  Filled 2014-02-07: qty 3

## 2014-02-07 MED ORDER — DIPHENHYDRAMINE HCL 50 MG/ML IJ SOLN
25.0000 mg | Freq: Once | INTRAMUSCULAR | Status: AC
  Administered 2014-02-07: 25 mg via INTRAVENOUS
  Filled 2014-02-07: qty 1

## 2014-02-07 MED ORDER — IBUPROFEN 800 MG PO TABS: 800.0000 mg | ORAL_TABLET | Freq: Three times a day (TID) | ORAL | Status: DC

## 2014-02-07 MED ORDER — LIDOCAINE-EPINEPHRINE 1 %-1:100000 IJ SOLN
20.0000 mL | Freq: Once | INTRAMUSCULAR | Status: AC
Start: 2014-02-07 — End: 2014-02-07
  Administered 2014-02-07: 10 mL
  Filled 2014-02-07: qty 20

## 2014-02-07 NOTE — Discharge Instructions (Signed)
Return to the ED, your primary care doctor, Urgent Care for suture removal in 10-14 days. Call for a follow up appointment with a Family or Primary Care Provider.  Return if Symptoms worsen.   Take medication as prescribed.  Do not operate heavy machinery or drink alcohol while taking Norco (narcotic medication). Elevate your foot above your heart to decrease swelling. Keep the wound clean and dry. Use crutches to walk until sutures are removed.

## 2014-02-07 NOTE — ED Provider Notes (Signed)
Medical screening examination/treatment/procedure(s) were performed by non-physician practitioner and as supervising physician I was immediately available for consultation/collaboration.   EKG Interpretation None       Samel Bruna K Lee Kalt-Rasch, MD 02/07/14 2304

## 2014-02-07 NOTE — ED Notes (Signed)
Pt verbalize understanding of follow up instructions and wound care.

## 2014-03-09 ENCOUNTER — Encounter (HOSPITAL_COMMUNITY): Payer: Self-pay | Admitting: Emergency Medicine

## 2014-03-09 ENCOUNTER — Emergency Department (HOSPITAL_COMMUNITY)
Admission: EM | Admit: 2014-03-09 | Discharge: 2014-03-09 | Disposition: A | Discharge status: Home / Self Care | Payer: BC Managed Care – PPO | Attending: Emergency Medicine | Admitting: Emergency Medicine

## 2014-03-09 DIAGNOSIS — J45909 Unspecified asthma, uncomplicated: Secondary | ICD-10-CM | POA: Insufficient documentation

## 2014-03-09 DIAGNOSIS — Z5189 Encounter for other specified aftercare: Secondary | ICD-10-CM

## 2014-03-09 DIAGNOSIS — Z79899 Other long term (current) drug therapy: Secondary | ICD-10-CM | POA: Insufficient documentation

## 2014-03-09 DIAGNOSIS — Z4801 Encounter for change or removal of surgical wound dressing: Secondary | ICD-10-CM | POA: Diagnosis present

## 2014-03-09 MED ORDER — BACITRACIN 500 UNIT/GM EX OINT: 1.0000 "application " | TOPICAL_OINTMENT | Freq: Two times a day (BID) | CUTANEOUS | Status: DC

## 2014-03-09 NOTE — ED Provider Notes (Signed)
Medical screening examination/treatment/procedure(s) were performed by non-physician practitioner and as supervising physician I was immediately available for consultation/collaboration.   EKG Interpretation None        Lyanne CoKevin M Iysis Germain, MD 03/09/14 [id number]

## 2014-03-09 NOTE — ED Provider Notes (Signed)
CSN: [id number]     Arrival date & time 03/09/14  0136 History   First MD Initiated Contact with Patient 03/09/14 0224     Chief Complaint  Patient presents with  . Wound Check     (Consider location/radiation/quality/duration/timing/severity/associated sxs/prior Treatment) HPI  Delynn FlavinQuinton E Puzzo is a 33 y.o. male presenting for wound check to sole of right foot. Patient stepped on a sharp kitchen utensil on September 16, he was sutured at the time of the incident and sutures were removed at the orthopedist several weeks ago. Patient denies increase in pain, redness, purulent discharge, fever, chills, nausea, vomiting, streaking up the leg. He is concerned because he feels that the wound is open. Has been covering the wound with bandage before he puts on his shoes.   Past Medical History  Diagnosis Date  . Asthma    Past Surgical History  Procedure Laterality Date  . Anterior cruciate ligament repair     History reviewed. No pertinent family history. History  Substance Use Topics  . Smoking status: Never Smoker   . Smokeless tobacco: Not on file  . Alcohol Use: No    Review of Systems  10 systems reviewed and found to be negative, except as noted in the HPI.   Allergies  Morphine and related  Home Medications   Prior to Admission medications   Medication Sig Start Date End Date Taking? Authorizing Provider  Multiple Vitamin (MULTIVITAMIN WITH MINERALS) TABS tablet Take 1 tablet by mouth daily.   Yes Historical Provider, MD  Omega-3 Fatty Acids (FISH OIL PO) Take 1 capsule by mouth daily.   Yes Historical Provider, MD   BP 137/85  Pulse 64  Temp(Src) 98.7 F (37.1 C) (Oral)  Resp 20  SpO2 100% Physical Exam  Nursing note and vitals reviewed. Constitutional: He is oriented to person, place, and time. He appears well-developed and well-nourished. No distress.  HENT:  Head: Normocephalic.  Eyes: Conjunctivae and EOM are normal.  Cardiovascular: Normal rate.    Pulmonary/Chest: Effort normal. No stridor.  Musculoskeletal: Normal range of motion.  Neurological: He is alert and oriented to person, place, and time.  Skin:  Well-healing laceration to sole of right foot between the great toe and the second toe. Granulation tissue noted, no erythema, warmth, tenderness palpation or discharge. No lymphangitis  Psychiatric: He has a normal mood and affect.    ED Course  Procedures (including critical care time) Labs Review Labs Reviewed - No data to display  Imaging Review No results found.   EKG Interpretation None      MDM   Final diagnoses:  Visit for wound check    Filed Vitals:   03/09/14 0144  BP: 137/85  Pulse: 64  Temp: 98.7 F (37.1 C)  TempSrc: Oral  Resp: 20  SpO2: 100%    Medications  bacitracin ointment 1 application (not administered)    Daevon E Andi DevonWhiteside is a 33 y.o. male presenting for wound check to laceration on the sole of the right foot, there are no signs of infection. Explained to him the natural process of wound healing from the inside out. Counseled him on wound care. Patient is following with a podiatrist degrees per orthopedics. Patient is requesting work note for tomorrow.  Evaluation does not show pathology that would require ongoing emergent intervention or inpatient treatment. Pt is hemodynamically stable and mentating appropriately. Discussed findings and plan with patient/guardian, who agrees with care plan. All questions answered. Return precautions discussed and outpatient follow  up given.      Wynetta Emeryicole Corrissa Martello, PA-C 03/09/14 0403

## 2014-03-09 NOTE — Discharge Instructions (Signed)
Wash the affected area with soap and water and apply a thin layer of topical antibiotic ointment. Do this every 12 hours.  ° °Do not use rubbing alcohol or hydrogen peroxide.                       ° °Look for signs of infection: if you see redness, if the area becomes warm, if pain increases sharply, there is discharge (pus), if red streaks appear or you develop fever or vomiting, RETURN immediately to the Emergency Department  for a recheck.  ° °

## 2014-03-09 NOTE — ED Notes (Signed)
Pt cut his foot last month and was sutured here.  Pt presents tonight d/t concern about infection in wound.  No pus noted at the site.  Denies fevers.

## 2014-04-02 ENCOUNTER — Encounter (HOSPITAL_COMMUNITY): Payer: Self-pay | Admitting: *Deleted

## 2014-04-02 ENCOUNTER — Emergency Department (HOSPITAL_COMMUNITY)
Admission: EM | Admit: 2014-04-02 | Discharge: 2014-04-02 | Disposition: A | Discharge status: Home / Self Care | Payer: BC Managed Care – PPO | Source: Home / Self Care | Attending: Family Medicine | Admitting: Family Medicine

## 2014-04-02 DIAGNOSIS — J069 Acute upper respiratory infection, unspecified: Secondary | ICD-10-CM

## 2014-04-02 MED ORDER — IPRATROPIUM BROMIDE 0.06 % NA SOLN: 2.0000 | Freq: Four times a day (QID) | NASAL | Status: DC

## 2014-04-02 MED ORDER — AZITHROMYCIN 250 MG PO TABS: ORAL_TABLET | ORAL | Status: DC

## 2014-04-02 NOTE — ED Notes (Signed)
Pt   Reports     Symptoms         Of   Cough         Congested       Body  Aches         And  Fever   With  Symptoms starting  Over  The  Weekend         Pt  Reports  Feeling   somehat  Better  At this time

## 2014-04-02 NOTE — ED Provider Notes (Signed)
CSN: [id number]     Arrival date & time 04/02/14  1213 History   First MD Initiated Contact with Patient 04/02/14 1235     Chief Complaint  Patient presents with  . URI   (Consider location/radiation/quality/duration/timing/severity/associated sxs/prior Treatment) Patient is a 33 y.o. male presenting with URI. The history is provided by the patient.  URI Presenting symptoms: congestion, cough, fever and rhinorrhea   Severity:  Moderate Onset quality:  Gradual Duration:  4 days Chronicity:  New Risk factors: no sick contacts     Past Medical History  Diagnosis Date  . Asthma    Past Surgical History  Procedure Laterality Date  . Anterior cruciate ligament repair     History reviewed. No pertinent family history. History  Substance Use Topics  . Smoking status: Never Smoker   . Smokeless tobacco: Not on file  . Alcohol Use: No    Review of Systems  Constitutional: Positive for fever.  HENT: Positive for congestion and rhinorrhea.   Respiratory: Positive for cough. Negative for shortness of breath.   Cardiovascular: Negative.     Allergies  Morphine and related  Home Medications   Prior to Admission medications   Medication Sig Start Date End Date Taking? Authorizing Provider  azithromycin (ZITHROMAX Z-PAK) 250 MG tablet Take as directed on pack 04/02/14   Linna HoffJames D Bond Grieshop, MD  ipratropium (ATROVENT) 0.06 % nasal spray Place 2 sprays into both nostrils 4 (four) times daily. 04/02/14   Linna HoffJames D Kenady Doxtater, MD  Multiple Vitamin (MULTIVITAMIN WITH MINERALS) TABS tablet Take 1 tablet by mouth daily.    Historical Provider, MD  Omega-3 Fatty Acids (FISH OIL PO) Take 1 capsule by mouth daily.    Historical Provider, MD   BP 134/78 mmHg  Pulse 78  Temp(Src) 98.6 F (37 C) (Oral)  Resp 18  SpO2 100% Physical Exam  Constitutional: He is oriented to person, place, and time. He appears well-developed and well-nourished.  HENT:  Right Ear: External ear normal.  Left Ear:  External ear normal.  Mouth/Throat: Oropharynx is clear and moist. No oropharyngeal exudate.  Neck: Normal range of motion. Neck supple.  Cardiovascular: Regular rhythm, normal heart sounds and intact distal pulses.   Pulmonary/Chest: Effort normal and breath sounds normal. He has no rales.  Neurological: He is alert and oriented to person, place, and time.  Skin: Skin is warm and dry.  Nursing note and vitals reviewed.   ED Course  Procedures (including critical care time) Labs Review Labs Reviewed - No data to display  Imaging Review No results found.   MDM   1. URI (upper respiratory infection)        Linna HoffJames D Yuritza Paulhus, MD 04/02/14 1253

## 2014-04-02 NOTE — Discharge Instructions (Signed)
Drink plenty of fluids as discussed, use medicine as prescribed, and mucinex or delsym for cough. Return or see your doctor if further problems °

## 2014-04-30 ENCOUNTER — Encounter (HOSPITAL_COMMUNITY): Payer: Self-pay

## 2014-04-30 ENCOUNTER — Emergency Department (HOSPITAL_COMMUNITY)
Admission: EM | Admit: 2014-04-30 | Discharge: 2014-04-30 | Disposition: A | Discharge status: Home / Self Care | Payer: BC Managed Care – PPO | Attending: Emergency Medicine | Admitting: Emergency Medicine

## 2014-04-30 DIAGNOSIS — J0110 Acute frontal sinusitis, unspecified: Secondary | ICD-10-CM | POA: Insufficient documentation

## 2014-04-30 DIAGNOSIS — J45909 Unspecified asthma, uncomplicated: Secondary | ICD-10-CM | POA: Diagnosis not present

## 2014-04-30 DIAGNOSIS — R05 Cough: Secondary | ICD-10-CM | POA: Insufficient documentation

## 2014-04-30 DIAGNOSIS — R0981 Nasal congestion: Secondary | ICD-10-CM | POA: Diagnosis present

## 2014-04-30 DIAGNOSIS — Z79899 Other long term (current) drug therapy: Secondary | ICD-10-CM | POA: Diagnosis not present

## 2014-04-30 MED ORDER — CETIRIZINE-PSEUDOEPHEDRINE ER 5-120 MG PO TB12
1.0000 | ORAL_TABLET | Freq: Two times a day (BID) | ORAL | Status: DC
Start: 2014-04-30 — End: 2015-08-09

## 2014-04-30 MED ORDER — NAPROXEN 500 MG PO TABS: 500.0000 mg | ORAL_TABLET | Freq: Two times a day (BID) | ORAL | Status: DC

## 2014-04-30 NOTE — ED Notes (Signed)
Pt presents with c/o nasal congestion and a cough that started Sunday. Pt reports the symptoms started approx 2 days ago. Pt reports he has started to feel better but needed to get checked out because of his job.

## 2014-04-30 NOTE — Discharge Instructions (Signed)
Recommend that you use your nasal spray or a saline nasal spray which can be found over-the-counter for sinus congestion. Takes Zyrtec-D as prescribed as well as naproxen for pain/headaches/body aches. Drink plenty of fluids to prevent dehydration. Follow-up with a primary care doctor to ensure resolution of symptoms. Return as needed if symptoms worsen.  Sinusitis Sinusitis is redness, soreness, and inflammation of the paranasal sinuses. Paranasal sinuses are air pockets within the bones of your face (beneath the eyes, the middle of the forehead, or above the eyes). In healthy paranasal sinuses, mucus is able to drain out, and air is able to circulate through them by way of your nose. However, when your paranasal sinuses are inflamed, mucus and air can become trapped. This can allow bacteria and other germs to grow and cause infection. Sinusitis can develop quickly and last only a short time (acute) or continue over a long period (chronic). Sinusitis that lasts for more than 12 weeks is considered chronic.  CAUSES  Causes of sinusitis include:  Allergies.  Structural abnormalities, such as displacement of the cartilage that separates your nostrils (deviated septum), which can decrease the air flow through your nose and sinuses and affect sinus drainage.  Functional abnormalities, such as when the small hairs (cilia) that line your sinuses and help remove mucus do not work properly or are not present. SIGNS AND SYMPTOMS  Symptoms of acute and chronic sinusitis are the same. The primary symptoms are pain and pressure around the affected sinuses. Other symptoms include:  Upper toothache.  Earache.  Headache.  Bad breath.  Decreased sense of smell and taste.  A cough, which worsens when you are lying flat.  Fatigue.  Fever.  Thick drainage from your nose, which often is green and may contain pus (purulent).  Swelling and warmth over the affected sinuses. DIAGNOSIS  Your health care  provider will perform a physical exam. During the exam, your health care provider may:  Look in your nose for signs of abnormal growths in your nostrils (nasal polyps).  Tap over the affected sinus to check for signs of infection.  View the inside of your sinuses (endoscopy) using an imaging device that has a light attached (endoscope). If your health care provider suspects that you have chronic sinusitis, one or more of the following tests may be recommended:  Allergy tests.  Nasal culture. A sample of mucus is taken from your nose, sent to a lab, and screened for bacteria.  Nasal cytology. A sample of mucus is taken from your nose and examined by your health care provider to determine if your sinusitis is related to an allergy. TREATMENT  Most cases of acute sinusitis are related to a viral infection and will resolve on their own within 10 days. Sometimes medicines are prescribed to help relieve symptoms (pain medicine, decongestants, nasal steroid sprays, or saline sprays).  However, for sinusitis related to a bacterial infection, your health care provider will prescribe antibiotic medicines. These are medicines that will help kill the bacteria causing the infection.  Rarely, sinusitis is caused by a fungal infection. In theses cases, your health care provider will prescribe antifungal medicine. For some cases of chronic sinusitis, surgery is needed. Generally, these are cases in which sinusitis recurs more than 3 times per year, despite other treatments. HOME CARE INSTRUCTIONS   Drink plenty of water. Water helps thin the mucus so your sinuses can drain more easily.  Use a humidifier.  Inhale steam 3 to 4 times a day (for  example, sit in the bathroom with the shower running).  Apply a warm, moist washcloth to your face 3 to 4 times a day, or as directed by your health care provider.  Use saline nasal sprays to help moisten and clean your sinuses.  Take medicines only as directed by  your health care provider.  If you were prescribed either an antibiotic or antifungal medicine, finish it all even if you start to feel better. SEEK IMMEDIATE MEDICAL CARE IF:  You have increasing pain or severe headaches.  You have nausea, vomiting, or drowsiness.  You have swelling around your face.  You have vision problems.  You have a stiff neck.  You have difficulty breathing. MAKE SURE YOU:   Understand these instructions.  Will watch your condition.  Will get help right away if you are not doing well or get worse. Document Released: 05/10/2005 Document Revised: 09/24/2013 Document Reviewed: 05/25/2011 [facility] Patient Information 2015 [employer], [place]. This information is not intended to replace advice given to you by your health care provider. Make sure you discuss any questions you have with your health care provider.

## 2014-04-30 NOTE — ED Provider Notes (Signed)
CSN: [id number]     Arrival date & time 04/30/14  2220 History  This chart was scribed for Antony MaduraKelly Quisha Mabie, PA-C working with Elwin MochaBlair Walden, MD by Evon Slackerrance Branch, ED Scribe. This patient was seen in room WTR8/WTR8 and the patient's care was started at 10:34 PM.    Chief Complaint  Patient presents with  . Nasal Congestion  . Cough   Patient is a 33 y.o. male presenting with cough. The history is provided by the patient. No language interpreter was used.  Cough Associated symptoms: no chest pain and no sore throat    HPI Comments: Jeffery Jordan is a 33 y.o. male who presents to the Emergency Department complaining of improving cough productive of sputum and congestion onset 2 days ago. He states he has associated headache, sinus pressure, fatigue, post nasal drip and ear pressure. Pt states that he did create an at home humidifier over the stove that did provide temporary relief. Pt states that he took some tylenol with no relief. Pt states that he has recently been around his son who had similar symptoms. Denies vomiting, chest pain or sore throat. Pt states that he has a Hx of seasonal allergies.    Past Medical History  Diagnosis Date  . Asthma    Past Surgical History  Procedure Laterality Date  . Anterior cruciate ligament repair     No family history on file. History  Substance Use Topics  . Smoking status: Never Smoker   . Smokeless tobacco: Not on file  . Alcohol Use: No    Review of Systems  Constitutional: Positive for fatigue.  HENT: Positive for congestion, postnasal drip and sinus pressure. Negative for sore throat.   Respiratory: Positive for cough.   Cardiovascular: Negative for chest pain.  Gastrointestinal: Negative for vomiting.  All other systems reviewed and are negative.   Allergies  Morphine and related  Home Medications   Prior to Admission medications   Medication Sig Start Date End Date Taking? Authorizing Provider  ibuprofen (ADVIL,MOTRIN)  200 MG tablet Take 400 mg by mouth every 6 (six) hours as needed for moderate pain.   Yes Historical Provider, MD  Multiple Vitamin (MULTIVITAMIN WITH MINERALS) TABS tablet Take 1 tablet by mouth daily.   Yes Historical Provider, MD  Omega-3 Fatty Acids (FISH OIL PO) Take 1 capsule by mouth daily.   Yes Historical Provider, MD  azithromycin (ZITHROMAX Z-PAK) 250 MG tablet Take as directed on pack Patient not taking: Reported on 04/30/2014 04/02/14   Linna HoffJames D Kindl, MD  cetirizine-pseudoephedrine (ZYRTEC-D) 5-120 MG per tablet Take 1 tablet by mouth 2 (two) times daily. 04/30/14   Antony MaduraKelly Nyxon Strupp, PA-C  ipratropium (ATROVENT) 0.06 % nasal spray Place 2 sprays into both nostrils 4 (four) times daily. 04/02/14   Linna HoffJames D Kindl, MD  naproxen (NAPROSYN) 500 MG tablet Take 1 tablet (500 mg total) by mouth 2 (two) times daily. 04/30/14   Antony MaduraKelly Rechel Delosreyes, PA-C   Triage Vitals: BP 137/90 mmHg  Pulse 71  Temp(Src) 98.1 F (36.7 C) (Oral)  Resp 12  SpO2 99%  Physical Exam  Constitutional: He is oriented to person, place, and time. He appears well-developed and well-nourished. No distress.  Nontoxic/nonseptic appearing  HENT:  Head: Normocephalic and atraumatic.  Right Ear: Hearing and ear canal normal. A middle ear effusion is present.  Left Ear: Hearing, tympanic membrane and ear canal normal.  Nose: Right sinus exhibits frontal sinus tenderness (Moderate). Right sinus exhibits no maxillary sinus tenderness. Left sinus  exhibits frontal sinus tenderness (Mild). Left sinus exhibits no maxillary sinus tenderness.  Mouth/Throat: Uvula is midline, oropharynx is clear and moist and mucous membranes are normal.  Eyes: Conjunctivae and EOM are normal. No scleral icterus.  Neck: Normal range of motion.  Cardiovascular: Normal rate, regular rhythm and normal heart sounds.   Pulmonary/Chest: Effort normal and breath sounds normal. No respiratory distress. He has no wheezes. He has no rales.  Chest expansion symmetric.  Lungs clear.  Musculoskeletal: Normal range of motion.  Neurological: He is alert and oriented to person, place, and time. He exhibits normal muscle tone. Coordination normal.  GCS 15. No focal neurologic deficits noted. Patient moves extremities without ataxia.  Skin: Skin is warm and dry. No rash noted. He is not diaphoretic. No erythema. No pallor.  Psychiatric: He has a normal mood and affect. His behavior is normal.  Nursing note and vitals reviewed.   ED Course  Procedures (including critical care time) DIAGNOSTIC STUDIES: Oxygen Saturation is 99% on RA, normal by my interpretation.    COORDINATION OF CARE: 11:14 PM-Discussed treatment plan with pt at bedside and pt agreed to plan.   Labs Review Labs Reviewed - No data to display  Imaging Review No results found.   EKG Interpretation None      MDM   Final diagnoses:  Acute frontal sinusitis, recurrence not specified    Patient complaining of symptoms of sinusitis. Mild to moderate symptoms of clear/yellow nasal discharge/congestion, postnasal drip, with cough for less than 10 days. Patient is afebrile. No concern for acute bacterial rhinosinusitis; likely viral in nature. Patient discharged with symptomatic treatment.  Patient instructions given for warm saline nasal washes. Recommendations for follow-up with primary care physician. Return precautions discussed and provided. Patient agreeable to plan with no unaddressed concerns.  I personally performed the services described in this documentation, which was scribed in my presence. The recorded information has been reviewed and is accurate.   Filed Vitals:   04/30/14 2228  BP: 137/90  Pulse: 71  Temp: 98.1 F (36.7 C)  TempSrc: Oral  Resp: 12  SpO2: 99%        Antony MaduraKelly Taiesha Bovard, PA-C 05/01/14 0014  Elwin MochaBlair Walden, MD 05/01/14 [phone]

## 2014-07-04 ENCOUNTER — Emergency Department (HOSPITAL_COMMUNITY)
Admission: EM | Admit: 2014-07-04 | Discharge: 2014-07-04 | Disposition: A | Discharge status: Home / Self Care | Payer: BC Managed Care – PPO | Source: Home / Self Care | Attending: Family Medicine | Admitting: Family Medicine

## 2014-07-04 ENCOUNTER — Encounter (HOSPITAL_COMMUNITY): Payer: Self-pay | Admitting: *Deleted

## 2014-07-04 DIAGNOSIS — J069 Acute upper respiratory infection, unspecified: Secondary | ICD-10-CM

## 2014-07-04 MED ORDER — IPRATROPIUM BROMIDE 0.06 % NA SOLN: 2.0000 | Freq: Four times a day (QID) | NASAL | Status: AC

## 2014-07-04 NOTE — Discharge Instructions (Signed)
Drink plenty of fluids as discussed, use medicine as prescribed, and mucinex or delsym for cough. Return or see your doctor if further problems °

## 2014-07-04 NOTE — ED Notes (Signed)
C/o headache under his eyes, runny nose onset 2/6.  He got worse on 2/8 with productive cough of clear sputum.  Had temp 99.9.  Taking Tylenol.  Feeling today but still has pressure and headache under his eyes and a little runny nose.

## 2014-07-04 NOTE — ED Provider Notes (Signed)
CSN: [id number]     Arrival date & time 07/04/14  1658 History   First MD Initiated Contact with Patient 07/04/14 1734     Chief Complaint  Patient presents with  . URI   (Consider location/radiation/quality/duration/timing/severity/associated sxs/prior Treatment) Patient is a 34 y.o. male presenting with URI. The history is provided by the patient.  URI Presenting symptoms: congestion, cough, facial pain and rhinorrhea   Presenting symptoms: no fever and no sore throat   Severity:  Mild Onset quality:  Gradual Duration:  5 days Progression:  Improving Chronicity:  New Relieved by:  OTC medications Associated symptoms: no headaches and no neck pain   Risk factors: sick contacts   Risk factors comment:  Requesting note to rtw in am.   Past Medical History  Diagnosis Date  . Asthma     as a child   Past Surgical History  Procedure Laterality Date  . Anterior cruciate ligament repair Right 2002  . Foot surgery Right 01/2014    laceration between great toe and 2nd toe   Family History  Problem Relation Age of Onset  . Heart attack Father    History  Substance Use Topics  . Smoking status: Never Smoker   . Smokeless tobacco: Not on file  . Alcohol Use: No    Review of Systems  Constitutional: Negative.  Negative for fever.  HENT: Positive for congestion, postnasal drip and rhinorrhea. Negative for sore throat.   Respiratory: Positive for cough.   Cardiovascular: Negative.   Gastrointestinal: Negative.   Musculoskeletal: Negative for neck pain.  Neurological: Negative for headaches.    Allergies  Morphine and related  Home Medications   Prior to Admission medications   Medication Sig Start Date End Date Taking? Authorizing Provider  Multiple Vitamin (MULTIVITAMIN WITH MINERALS) TABS tablet Take 1 tablet by mouth daily.   Yes Historical Provider, MD  Omega-3 Fatty Acids (FISH OIL PO) Take 1 capsule by mouth daily.   Yes Historical Provider, MD   Phenylephrine-DM-GG-APAP (TYLENOL COLD/FLU SEVERE PO) Take by mouth.   Yes Historical Provider, MD  azithromycin (ZITHROMAX Z-PAK) 250 MG tablet Take as directed on pack Patient not taking: Reported on 04/30/2014 04/02/14   Linna HoffJames D Trenia Tennyson, MD  cetirizine-pseudoephedrine (ZYRTEC-D) 5-120 MG per tablet Take 1 tablet by mouth 2 (two) times daily. 04/30/14   Antony MaduraKelly Humes, PA-C  ibuprofen (ADVIL,MOTRIN) 200 MG tablet Take 400 mg by mouth every 6 (six) hours as needed for moderate pain.    Historical Provider, MD  ipratropium (ATROVENT) 0.06 % nasal spray Place 2 sprays into both nostrils 4 (four) times daily. 07/04/14   Linna HoffJames D Zahniya Zellars, MD  naproxen (NAPROSYN) 500 MG tablet Take 1 tablet (500 mg total) by mouth 2 (two) times daily. 04/30/14   Antony MaduraKelly Humes, PA-C   BP 155/86 mmHg  Pulse 74  Temp(Src) 98.2 F (36.8 C) (Oral)  Resp 16  SpO2 96% Physical Exam  Constitutional: He is oriented to person, place, and time. He appears well-nourished. No distress.  HENT:  Head: Normocephalic.  Right Ear: External ear normal.  Left Ear: External ear normal.  Mouth/Throat: Oropharynx is clear and moist.  Eyes: Pupils are equal, round, and reactive to light.  Neck: Normal range of motion. Neck supple.  Cardiovascular: Normal heart sounds.   Pulmonary/Chest: Effort normal and breath sounds normal.  Neurological: He is alert and oriented to person, place, and time.  Skin: Skin is warm and dry.  Nursing note and vitals reviewed.   ED  Course  Procedures (including critical care time) Labs Review Labs Reviewed - No data to display  Imaging Review No results found.   MDM   1. URI (upper respiratory infection)       Linna Hoff, MD 07/05/14 1121

## 2014-07-10 ENCOUNTER — Emergency Department (INDEPENDENT_AMBULATORY_CARE_PROVIDER_SITE_OTHER)
Admission: EM | Admit: 2014-07-10 | Discharge: 2014-07-10 | Disposition: A | Discharge status: Home / Self Care | Payer: BC Managed Care – PPO | Source: Home / Self Care | Attending: Family Medicine | Admitting: Family Medicine

## 2014-07-10 ENCOUNTER — Encounter (HOSPITAL_COMMUNITY): Payer: Self-pay | Admitting: *Deleted

## 2014-07-10 DIAGNOSIS — K529 Noninfective gastroenteritis and colitis, unspecified: Secondary | ICD-10-CM

## 2014-07-10 MED ORDER — ONDANSETRON HCL 4 MG PO TABS: 4.0000 mg | ORAL_TABLET | Freq: Three times a day (TID) | ORAL | Status: AC | PRN

## 2014-07-10 NOTE — ED Provider Notes (Signed)
CSN: [id number]     Arrival date & time 07/10/14  [id number] History   First MD Initiated Contact with Patient 07/10/14 1111     Chief Complaint  Patient presents with  . Diarrhea   (Consider location/radiation/quality/duration/timing/severity/associated sxs/prior Treatment) HPI Comments: Patient presents for evaluation of 24 hours of N/V/D with associated intermittent abdominal cramping, anorexia, chills and subjective fever overnight. Patient states as of this morning, nausea and vomiting have resolved and diarrhea has slowed with only two loose, non-bloody BMs today. Tolerating clears and toast for breakfast. 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 PCP: none Reports himself to be otherwise healthy.   The history is provided by the patient.    Past Medical History  Diagnosis Date  . Asthma     as a child   Past Surgical History  Procedure Laterality Date  . Anterior cruciate ligament repair Right 2002  . Foot surgery Right 01/2014    laceration between great toe and 2nd toe   Family History  Problem Relation Age of Onset  . Heart attack Father    History  Substance Use Topics  . Smoking status: Never Smoker   . Smokeless tobacco: Not on file  . Alcohol Use: No    Review of Systems  Constitutional: Positive for fever, chills and fatigue.  Gastrointestinal: Positive for nausea, vomiting, abdominal pain and diarrhea. Negative for constipation and blood in stool.  Genitourinary: Negative.   All other systems reviewed and are negative.   Allergies  Morphine and related  Home Medications   Prior to Admission medications   Medication Sig Start Date End Date Taking? Authorizing Provider  azithromycin (ZITHROMAX Z-PAK) 250 MG tablet Take as directed on pack Patient not taking: Reported on 04/30/2014 04/02/14   Linna HoffJames D Kindl, MD  cetirizine-pseudoephedrine (ZYRTEC-D) 5-120 MG per tablet Take 1 tablet by mouth 2 (two) times  daily. 04/30/14   Antony MaduraKelly Humes, PA-C  ibuprofen (ADVIL,MOTRIN) 200 MG tablet Take 400 mg by mouth every 6 (six) hours as needed for moderate pain.    Historical Provider, MD  ipratropium (ATROVENT) 0.06 % nasal spray Place 2 sprays into both nostrils 4 (four) times daily. 07/04/14   Linna HoffJames D Kindl, MD  Multiple Vitamin (MULTIVITAMIN WITH MINERALS) TABS tablet Take 1 tablet by mouth daily.    Historical Provider, MD  naproxen (NAPROSYN) 500 MG tablet Take 1 tablet (500 mg total) by mouth 2 (two) times daily. 04/30/14   Antony MaduraKelly Humes, PA-C  Omega-3 Fatty Acids (FISH OIL PO) Take 1 capsule by mouth daily.    Historical Provider, MD  ondansetron (ZOFRAN) 4 MG tablet Take 1 tablet (4 mg total) by mouth every 8 (eight) hours as needed for nausea or vomiting. 07/10/14   Mathis FareJennifer Lee H Brieonna Crutcher, PA  Phenylephrine-DM-GG-APAP (TYLENOL COLD/FLU SEVERE PO) Take by mouth.    Historical Provider, MD   BP 130/72 mmHg  Pulse 72  Temp(Src) 99.4 F (37.4 C) (Oral)  Resp 18  SpO2 100% Physical Exam  Constitutional: He is oriented to person, place, and time. He appears well-developed and well-nourished. No distress.  HENT:  Head: Normocephalic and atraumatic.  Mouth/Throat: Oropharynx is clear and moist.  Eyes: Conjunctivae are normal. No scleral icterus.  Cardiovascular: Normal rate, regular rhythm and normal heart sounds.   Pulmonary/Chest: Effort normal and breath sounds normal.  Abdominal: Soft. Bowel sounds are normal. He exhibits no distension and no mass. There is no tenderness. There is no rebound  and no guarding.  Musculoskeletal: Normal range of motion.  Neurological: He is alert and oriented to person, place, and time.  Skin: Skin is warm and dry.  Psychiatric: He has a normal mood and affect. His behavior is normal.  Nursing note and vitals reviewed.   ED Course  Procedures (including critical care time) Labs Review Labs Reviewed - No data to display  Imaging Review No results found.   MDM    1. Gastroenteritis   Zofran as prescribed for nausea Bland diet until diarrhea resolves Advance diet as tolerated Expect continued improvement over next few days     Ria Clock, PA 07/10/14 1135

## 2014-07-10 NOTE — Discharge Instructions (Signed)
Food Choices to Help Relieve Diarrhea °When you have diarrhea, the foods you eat and your eating habits are very important. Choosing the right foods and drinks can help relieve diarrhea. Also, because diarrhea can last up to 7 days, you need to replace lost fluids and electrolytes (such as sodium, potassium, and chloride) in order to help prevent dehydration.  °WHAT GENERAL GUIDELINES DO I NEED TO FOLLOW? °· Slowly drink 1 cup (8 oz) of fluid for each episode of diarrhea. If you are getting enough fluid, your urine will be clear or pale yellow. °· Eat starchy foods. Some good choices include white rice, white toast, pasta, low-fiber cereal, baked potatoes (without the skin), saltine crackers, and bagels. °· Avoid large servings of any cooked vegetables. °· Limit fruit to two servings per day. A serving is ½ cup or 1 small piece. °· Choose foods with less than 2 g of fiber per serving. °· Limit fats to less than 8 tsp (38 g) per day. °· Avoid fried foods. °· Eat foods that have probiotics in them. Probiotics can be found in certain dairy products. °· Avoid foods and beverages that may increase the speed at which food moves through the stomach and intestines (gastrointestinal tract). Things to avoid include: °¨ High-fiber foods, such as dried fruit, raw fruits and vegetables, nuts, seeds, and whole grain foods. °¨ Spicy foods and high-fat foods. °¨ Foods and beverages sweetened with high-fructose corn syrup, honey, or sugar alcohols such as xylitol, sorbitol, and mannitol. °WHAT FOODS ARE RECOMMENDED? °Grains °White rice. White, French, or pita breads (fresh or toasted), including plain rolls, buns, or bagels. White pasta. Saltine, soda, or graham crackers. Pretzels. Low-fiber cereal. Cooked cereals made with water (such as cornmeal, farina, or cream cereals). Plain muffins. Matzo. Melba toast. Zwieback.  °Vegetables °Potatoes (without the skin). Strained tomato and vegetable juices. Most well-cooked and canned  vegetables without seeds. Tender lettuce. °Fruits °Cooked or canned applesauce, apricots, cherries, fruit cocktail, grapefruit, peaches, pears, or plums. Fresh bananas, apples without skin, cherries, grapes, cantaloupe, grapefruit, peaches, oranges, or plums.  °Meat and Other Protein Products °Baked or boiled chicken. Eggs. Tofu. Fish. Seafood. Smooth peanut butter. Ground or well-cooked tender beef, ham, veal, lamb, pork, or poultry.  °Dairy °Plain yogurt, kefir, and unsweetened liquid yogurt. Lactose-free milk, buttermilk, or soy milk. Plain hard cheese. °Beverages °Sport drinks. Clear broths. Diluted fruit juices (except prune). Regular, caffeine-free sodas such as ginger ale. Water. Decaffeinated teas. Oral rehydration solutions. Sugar-free beverages not sweetened with sugar alcohols. °Other °Bouillon, broth, or soups made from recommended foods.  °The items listed above may not be a complete list of recommended foods or beverages. Contact your dietitian for more options. °WHAT FOODS ARE NOT RECOMMENDED? °Grains °Whole grain, whole wheat, bran, or rye breads, rolls, pastas, crackers, and cereals. Wild or brown rice. Cereals that contain more than 2 g of fiber per serving. Corn tortillas or taco shells. Cooked or dry oatmeal. Granola. Popcorn. °Vegetables °Raw vegetables. Cabbage, broccoli, Brussels sprouts, artichokes, baked beans, beet greens, corn, kale, legumes, peas, sweet potatoes, and yams. Potato skins. Cooked spinach and cabbage. °Fruits °Dried fruit, including raisins and dates. Raw fruits. Stewed or dried prunes. Fresh apples with skin, apricots, mangoes, pears, raspberries, and strawberries.  °Meat and Other Protein Products °Chunky peanut butter. Nuts and seeds. Beans and lentils. Bacon.  °Dairy °High-fat cheeses. Milk, chocolate milk, and beverages made with milk, such as milk shakes. Cream. Ice cream. °Sweets and Desserts °Sweet rolls, doughnuts, and sweet breads. Pancakes   and waffles. °Fats and  Oils °Butter. Cream sauces. Margarine. Salad oils. Plain salad dressings. Olives. Avocados.  °Beverages °Caffeinated beverages (such as coffee, tea, soda, or energy drinks). Alcoholic beverages. Fruit juices with pulp. Prune juice. Soft drinks sweetened with high-fructose corn syrup or sugar alcohols. °Other °Coconut. Hot sauce. Chili powder. Mayonnaise. Gravy. Cream-based or milk-based soups.  °The items listed above may not be a complete list of foods and beverages to avoid. Contact your dietitian for more information. °WHAT SHOULD I DO IF I BECOME DEHYDRATED? °Diarrhea can sometimes lead to dehydration. Signs of dehydration include dark urine and dry mouth and skin. If you think you are dehydrated, you should rehydrate with an oral rehydration solution. These solutions can be purchased at pharmacies, retail stores, or online.  °Drink ½-1 cup (120-240 mL) of oral rehydration solution each time you have an episode of diarrhea. If drinking this amount makes your diarrhea worse, try drinking smaller amounts more often. For example, drink 1-3 tsp (5-15 mL) every 5-10 minutes.  °A general rule for staying hydrated is to drink 1½-2 L of fluid per day. Talk to your health care provider about the specific amount you should be drinking each day. Drink enough fluids to keep your urine clear or pale yellow. °Document Released: 07/31/2003 Document Revised: 05/15/2013 Document Reviewed: 04/02/2013 °Exit[employer]® Patient Information ©2015 [employer]. This information is not intended to replace advice given to you by your health care provider. Make sure you discuss any questions you have with your health care provider. °Viral Gastroenteritis °Viral gastroenteritis is also known as stomach flu. This condition affects the stomach and intestinal tract. It can cause sudden diarrhea and vomiting. The illness typically lasts 3 to 8 days. Most people develop an immune response that eventually gets rid of the virus. While this natural  response develops, the virus can make you quite ill. °CAUSES  °Many different viruses can cause gastroenteritis, such as rotavirus or noroviruses. You can catch one of these viruses by consuming contaminated food or water. You may also catch a virus by sharing utensils or other personal items with an infected person or by touching a contaminated surface. °SYMPTOMS  °The most common symptoms are diarrhea and vomiting. These problems can cause a severe loss of body fluids (dehydration) and a body salt (electrolyte) imbalance. Other symptoms may include: °· Fever. °· Headache. °· Fatigue. °· Abdominal pain. °DIAGNOSIS  °Your caregiver can usually diagnose viral gastroenteritis based on your symptoms and a physical exam. A stool sample may also be taken to test for the presence of viruses or other infections. °TREATMENT  °This illness typically goes away on its own. Treatments are aimed at rehydration. The most serious cases of viral gastroenteritis involve vomiting so severely that you are not able to keep fluids down. In these cases, fluids must be given through an intravenous line (IV). °HOME CARE INSTRUCTIONS  °· Drink enough fluids to keep your urine clear or pale yellow. Drink small amounts of fluids frequently and increase the amounts as tolerated. °· Ask your caregiver for specific rehydration instructions. °· Avoid: °¨ Foods high in sugar. °¨ Alcohol. °¨ Carbonated drinks. °¨ Tobacco. °¨ Juice. °¨ Caffeine drinks. °¨ Extremely hot or cold fluids. °¨ Fatty, greasy foods. °¨ Too much intake of anything at one time. °¨ Dairy products until 24 to 48 hours after diarrhea stops. °· You may consume probiotics. Probiotics are active cultures of beneficial bacteria. They may lessen the amount and number of diarrheal stools in adults. Probiotics   can be found in yogurt with active cultures and in supplements. °· Wash your hands well to avoid spreading the virus. °· Only take over-the-counter or prescription medicines for  pain, discomfort, or fever as directed by your caregiver. Do not give aspirin to children. Antidiarrheal medicines are not recommended. °· Ask your caregiver if you should continue to take your regular prescribed and over-the-counter medicines. °· Keep all follow-up appointments as directed by your caregiver. °SEEK IMMEDIATE MEDICAL CARE IF:  °· You are unable to keep fluids down. °· You do not urinate at least once every 6 to 8 hours. °· You develop shortness of breath. °· You notice blood in your stool or vomit. This may look like coffee grounds. °· You have abdominal pain that increases or is concentrated in one small area (localized). °· You have persistent vomiting or diarrhea. °· You have a fever. °· The patient is a child younger than 3 months, and he or she has a fever. °· The patient is a child older than 3 months, and he or she has a fever and persistent symptoms. °· The patient is a child older than 3 months, and he or she has a fever and symptoms suddenly get worse. °· The patient is a baby, and he or she has no tears when crying. °MAKE SURE YOU:  °· Understand these instructions. °· Will watch your condition. °· Will get help right away if you are not doing well or get worse. °Document Released: 05/10/2005 Document Revised: 08/02/2011 Document Reviewed: 02/24/2011 °ExitCare® Patient Information ©2015 [employer]. This information is not intended to replace advice given to you by your health care provider. Make sure you discuss any questions you have with your health care provider. ° °

## 2014-07-10 NOTE — ED Notes (Addendum)
Pt  Became  Sick  Yesterday     At  Work  He  Had  Diarrhea   Vomited  yest  And  Has   Some  Nausea  And  Cramps      -       Pt  Just  Finished    Anti  Biotics

## 2014-07-15 ENCOUNTER — Emergency Department (HOSPITAL_COMMUNITY)
Admission: EM | Admit: 2014-07-15 | Discharge: 2014-07-15 | Disposition: A | Discharge status: Home / Self Care | Payer: BC Managed Care – PPO | Attending: Emergency Medicine | Admitting: Emergency Medicine

## 2014-07-15 ENCOUNTER — Encounter (HOSPITAL_COMMUNITY): Payer: Self-pay | Admitting: Emergency Medicine

## 2014-07-15 DIAGNOSIS — R5383 Other fatigue: Secondary | ICD-10-CM | POA: Diagnosis not present

## 2014-07-15 DIAGNOSIS — R531 Weakness: Secondary | ICD-10-CM | POA: Diagnosis not present

## 2014-07-15 DIAGNOSIS — J029 Acute pharyngitis, unspecified: Secondary | ICD-10-CM | POA: Diagnosis not present

## 2014-07-15 DIAGNOSIS — J45909 Unspecified asthma, uncomplicated: Secondary | ICD-10-CM | POA: Insufficient documentation

## 2014-07-15 LAB — RAPID STREP SCREEN (MED CTR MEBANE ONLY): STREPTOCOCCUS, GROUP A SCREEN (DIRECT): NEGATIVE

## 2014-07-15 MED ORDER — DEXAMETHASONE SODIUM PHOSPHATE 10 MG/ML IJ SOLN
10.0000 mg | Freq: Once | INTRAMUSCULAR | Status: AC
  Administered 2014-07-15: 10 mg via INTRAVENOUS
  Filled 2014-07-15: qty 1

## 2014-07-15 MED ORDER — IBUPROFEN 600 MG PO TABS: 600.0000 mg | ORAL_TABLET | Freq: Four times a day (QID) | ORAL | Status: AC | PRN

## 2014-07-15 MED ORDER — IBUPROFEN 200 MG PO TABS
600.0000 mg | ORAL_TABLET | Freq: Once | ORAL | Status: AC
  Administered 2014-07-15: 600 mg via ORAL
  Filled 2014-07-15: qty 3

## 2014-07-15 MED ORDER — ACETAMINOPHEN 325 MG PO TABS
650.0000 mg | ORAL_TABLET | Freq: Four times a day (QID) | ORAL | Status: DC | PRN
  Administered 2014-07-15: 650 mg via ORAL
  Filled 2014-07-15: qty 2

## 2014-07-15 NOTE — ED Notes (Signed)
Pt c/o fever, sore throat, tenderness to anterior neck bilaterally, and generalized fatigue x 1 wk.

## 2014-07-15 NOTE — Discharge Instructions (Signed)
Strep results are negative. We suspect viral pharyngitis. You will receive a call from the hospital if there is bacteria growing on the throat swab. Hydrate, and take motrin.  Pharyngitis Pharyngitis is redness, pain, and swelling (inflammation) of your pharynx.  CAUSES  Pharyngitis is usually caused by infection. Most of the time, these infections are from viruses (viral) and are part of a cold. However, sometimes pharyngitis is caused by bacteria (bacterial). Pharyngitis can also be caused by allergies. Viral pharyngitis may be spread from person to person by coughing, sneezing, and personal items or utensils (cups, forks, spoons, toothbrushes). Bacterial pharyngitis may be spread from person to person by more intimate contact, such as kissing.  SIGNS AND SYMPTOMS  Symptoms of pharyngitis include:   Sore throat.   Tiredness (fatigue).   Low-grade fever.   Headache.  Joint pain and muscle aches.  Skin rashes.  Swollen lymph nodes.  Plaque-like film on throat or tonsils (often seen with bacterial pharyngitis). DIAGNOSIS  Your health care provider will ask you questions about your illness and your symptoms. Your medical history, along with a physical exam, is often all that is needed to diagnose pharyngitis. Sometimes, a rapid strep test is done. Other lab tests may also be done, depending on the suspected cause.  TREATMENT  Viral pharyngitis will usually get better in 3-4 days without the use of medicine. Bacterial pharyngitis is treated with medicines that kill germs (antibiotics).  HOME CARE INSTRUCTIONS   Drink enough water and fluids to keep your urine clear or pale yellow.   Only take over-the-counter or prescription medicines as directed by your health care provider:   If you are prescribed antibiotics, make sure you finish them even if you start to feel better.   Do not take aspirin.   Get lots of rest.   Gargle with 8 oz of salt water ( tsp of salt per 1 qt  of water) as often as every 1-2 hours to soothe your throat.   Throat lozenges (if you are not at risk for choking) or sprays may be used to soothe your throat. SEEK MEDICAL CARE IF:   You have large, tender lumps in your neck.  You have a rash.  You cough up green, yellow-brown, or bloody spit. SEEK IMMEDIATE MEDICAL CARE IF:   Your neck becomes stiff.  You drool or are unable to swallow liquids.  You vomit or are unable to keep medicines or liquids down.  You have severe pain that does not go away with the use of recommended medicines.  You have trouble breathing (not caused by a stuffy nose). MAKE SURE YOU:   Understand these instructions.  Will watch your condition.  Will get help right away if you are not doing well or get worse. Document Released: 05/10/2005 Document Revised: 02/28/2013 Document Reviewed: 01/15/2013 [facility] Patient Information 2015 [place]. This information is not intended to replace advice given to you by your health care provider. Make sure you discuss any questions you have with your health care provider.   Pharyngitis Pharyngitis is redness, pain, and swelling (inflammation) of your pharynx.  CAUSES  Pharyngitis is usually caused by infection. Most of the time, these infections are from viruses (viral) and are part of a cold. However, sometimes pharyngitis is caused by bacteria (bacterial). Pharyngitis can also be caused by allergies. Viral pharyngitis may be spread from person to person by coughing, sneezing, and personal items or utensils (cups, forks, spoons, toothbrushes). Bacterial pharyngitis may be spread from  person to person by more intimate contact, such as kissing.  SIGNS AND SYMPTOMS  Symptoms of pharyngitis include:   Sore throat.   Tiredness (fatigue).   Low-grade fever.   Headache.  Joint pain and muscle aches.  Skin rashes.  Swollen lymph nodes.  Plaque-like film on throat or tonsils (often seen with  bacterial pharyngitis). DIAGNOSIS  Your health care provider will ask you questions about your illness and your symptoms. Your medical history, along with a physical exam, is often all that is needed to diagnose pharyngitis. Sometimes, a rapid strep test is done. Other lab tests may also be done, depending on the suspected cause.  TREATMENT  Viral pharyngitis will usually get better in 3-4 days without the use of medicine. Bacterial pharyngitis is treated with medicines that kill germs (antibiotics).  HOME CARE INSTRUCTIONS   Drink enough water and fluids to keep your urine clear or pale yellow.   Only take over-the-counter or prescription medicines as directed by your health care provider:   If you are prescribed antibiotics, make sure you finish them even if you start to feel better.   Do not take aspirin.   Get lots of rest.   Gargle with 8 oz of salt water ( tsp of salt per 1 qt of water) as often as every 1-2 hours to soothe your throat.   Throat lozenges (if you are not at risk for choking) or sprays may be used to soothe your throat. SEEK MEDICAL CARE IF:   You have large, tender lumps in your neck.  You have a rash.  You cough up green, yellow-brown, or bloody spit. SEEK IMMEDIATE MEDICAL CARE IF:   Your neck becomes stiff.  You drool or are unable to swallow liquids.  You vomit or are unable to keep medicines or liquids down.  You have severe pain that does not go away with the use of recommended medicines.  You have trouble breathing (not caused by a stuffy nose). MAKE SURE YOU:   Understand these instructions.  Will watch your condition.  Will get help right away if you are not doing well or get worse. Document Released: 05/10/2005 Document Revised: 02/28/2013 Document Reviewed: 01/15/2013 [facility] Patient Information 2015 [place], [place]. This information is not intended to replace advice given to you by your health care provider. Make sure you discuss  any questions you have with your health care provider.

## 2014-07-15 NOTE — ED Provider Notes (Signed)
CSN: [id number]     Arrival date & time 07/15/14  0046 History   First MD Initiated Contact with Patient 07/15/14 0125     Chief Complaint  Patient presents with  . Fever  . Sore Throat     (Consider location/radiation/quality/duration/timing/severity/associated sxs/prior Treatment) HPI Comments: Pt comes in with cc of sore throat. Healthy male. Pt started having neck pain, anterior and fevers yday. Patient has dysphagia and odynophagia. There is no cough, but pt is spitting out some greenish phlemgm. No chest pain, dib and no other uri like sx.   Patient is a 34 y.o. male presenting with fever and pharyngitis. The history is provided by the patient.  Fever Associated symptoms: sore throat   Associated symptoms: no chest pain, no congestion, no cough and no rhinorrhea   Sore Throat Pertinent negatives include no chest pain.    Past Medical History  Diagnosis Date  . Asthma     as a child   Past Surgical History  Procedure Laterality Date  . Anterior cruciate ligament repair Right 2002  . Foot surgery Right 01/2014    laceration between great toe and 2nd toe   Family History  Problem Relation Age of Onset  . Heart attack Father    History  Substance Use Topics  . Smoking status: Never Smoker   . Smokeless tobacco: Not on file  . Alcohol Use: No    Review of Systems  Constitutional: Positive for fever and fatigue.  HENT: Positive for sore throat and trouble swallowing. Negative for congestion, dental problem, drooling, rhinorrhea, sinus pressure and voice change.   Respiratory: Negative for cough.   Cardiovascular: Negative for chest pain.  Allergic/Immunologic: Negative for immunocompromised state.  Neurological: Positive for weakness.      Allergies  Morphine and related  Home Medications   Prior to Admission medications   Medication Sig Start Date End Date Taking? Authorizing Provider  diphenhydrAMINE (BENADRYL) 25 mg capsule Take 25 mg by mouth every 6  (six) hours as needed for allergies.   Yes Historical Provider, MD  ipratropium (ATROVENT) 0.06 % nasal spray Place 2 sprays into both nostrils 4 (four) times daily. 07/04/14  Yes Linna HoffJames D Kindl, MD  Multiple Vitamin (MULTIVITAMIN WITH MINERALS) TABS tablet Take 1 tablet by mouth daily.   Yes Historical Provider, MD  Omega-3 Fatty Acids (FISH OIL PO) Take 1 capsule by mouth daily.   Yes Historical Provider, MD  ondansetron (ZOFRAN) 4 MG tablet Take 1 tablet (4 mg total) by mouth every 8 (eight) hours as needed for nausea or vomiting. 07/10/14  Yes Ria ClockJennifer Lee H Presson, PA  azithromycin (ZITHROMAX Z-PAK) 250 MG tablet Take as directed on pack Patient not taking: Reported on 04/30/2014 04/02/14   Linna HoffJames D Kindl, MD  cetirizine-pseudoephedrine (ZYRTEC-D) 5-120 MG per tablet Take 1 tablet by mouth 2 (two) times daily. Patient not taking: Reported on 07/15/2014 04/30/14   Antony MaduraKelly Humes, PA-C  ibuprofen (ADVIL,MOTRIN) 600 MG tablet Take 1 tablet (600 mg total) by mouth every 6 (six) hours as needed. 07/15/14   Derwood KaplanAnkit Velmer Woelfel, MD  naproxen (NAPROSYN) 500 MG tablet Take 1 tablet (500 mg total) by mouth 2 (two) times daily. Patient not taking: Reported on 07/15/2014 04/30/14   Antony MaduraKelly Humes, PA-C  Phenylephrine-DM-GG-APAP (TYLENOL COLD/FLU SEVERE PO) Take by mouth.    Historical Provider, MD   BP 117/69 mmHg  Pulse 106  Temp(Src) 100.7 F (38.2 C) (Oral)  Resp 22  Ht 5\' 11"  (1.803 m)  Wt  238 lb (107.956 kg)  BMI 33.21 kg/m2  SpO2 100% Physical Exam  HENT:  Head: Normocephalic and atraumatic.  Mouth/Throat: No oropharyngeal exudate.  Pulmonary/Chest: Effort normal. No respiratory distress. He has no wheezes.  Lymphadenopathy:    He has cervical adenopathy.  Nursing note and vitals reviewed.   ED Course  Procedures (including critical care time) Labs Review Labs Reviewed  RAPID STREP SCREEN  CULTURE, GROUP A STREP    Imaging Review No results found.   EKG Interpretation None      MDM    Final diagnoses:  Pharyngitis    Pt comes in with sorethroat. + fevers, absence of cough, and cervical lymphadenopathy. No exudates and no tonsillar enlargement appreciated.  Rapid strep is neg. Will f/u on the cultures. Possibly viral pharyngitis.    Derwood Kaplan, MD 07/15/14 [phone]

## 2014-07-17 ENCOUNTER — Emergency Department (HOSPITAL_COMMUNITY)
Admission: EM | Admit: 2014-07-17 | Discharge: 2014-07-17 | Disposition: A | Discharge status: Home / Self Care | Payer: BC Managed Care – PPO | Attending: Emergency Medicine | Admitting: Emergency Medicine

## 2014-07-17 ENCOUNTER — Encounter (HOSPITAL_COMMUNITY): Payer: Self-pay | Admitting: Emergency Medicine

## 2014-07-17 DIAGNOSIS — R0981 Nasal congestion: Secondary | ICD-10-CM

## 2014-07-17 DIAGNOSIS — Z79899 Other long term (current) drug therapy: Secondary | ICD-10-CM | POA: Insufficient documentation

## 2014-07-17 DIAGNOSIS — J45909 Unspecified asthma, uncomplicated: Secondary | ICD-10-CM | POA: Insufficient documentation

## 2014-07-17 NOTE — ED Provider Notes (Signed)
CSN: [id number]     Arrival date & time 07/17/14  0711 History   First MD Initiated Contact with Patient 07/17/14 [phone]     Chief Complaint  Patient presents with  . Nasal Congestion     (Consider location/radiation/quality/duration/timing/severity/associated sxs/prior Treatment) Patient is a 34 y.o. male presenting with URI.  URI Presenting symptoms: congestion and cough   Severity:  Moderate Onset quality:  Gradual Duration:  1 week Timing:  Constant Progression:  Partially resolved Chronicity:  New Relieved by:  Nothing Worsened by:  Nothing tried Associated symptoms: sinus pain   Associated symptoms: no arthralgias     Past Medical History  Diagnosis Date  . Asthma     as a child   Past Surgical History  Procedure Laterality Date  . Anterior cruciate ligament repair Right 2002  . Foot surgery Right 01/2014    laceration between great toe and 2nd toe   Family History  Problem Relation Age of Onset  . Heart attack Father    History  Substance Use Topics  . Smoking status: Never Smoker   . Smokeless tobacco: Not on file  . Alcohol Use: No    Review of Systems  HENT: Positive for congestion.   Respiratory: Positive for cough.   Musculoskeletal: Negative for arthralgias.  All other systems reviewed and are negative.     Allergies  Morphine and related  Home Medications   Prior to Admission medications   Medication Sig Start Date End Date Taking? Authorizing Provider  azithromycin (ZITHROMAX Z-PAK) 250 MG tablet Take as directed on pack Patient not taking: Reported on 04/30/2014 04/02/14   Linna HoffJames D Kindl, MD  cetirizine-pseudoephedrine (ZYRTEC-D) 5-120 MG per tablet Take 1 tablet by mouth 2 (two) times daily. Patient not taking: Reported on 07/15/2014 04/30/14   Antony MaduraKelly Humes, PA-C  diphenhydrAMINE (BENADRYL) 25 mg capsule Take 25 mg by mouth every 6 (six) hours as needed for allergies.    Historical Provider, MD  ibuprofen (ADVIL,MOTRIN) 600 MG tablet Take 1  tablet (600 mg total) by mouth every 6 (six) hours as needed. 07/15/14   Derwood KaplanAnkit Nanavati, MD  ipratropium (ATROVENT) 0.06 % nasal spray Place 2 sprays into both nostrils 4 (four) times daily. 07/04/14   Linna HoffJames D Kindl, MD  Multiple Vitamin (MULTIVITAMIN WITH MINERALS) TABS tablet Take 1 tablet by mouth daily.    Historical Provider, MD  naproxen (NAPROSYN) 500 MG tablet Take 1 tablet (500 mg total) by mouth 2 (two) times daily. Patient not taking: Reported on 07/15/2014 04/30/14   Antony MaduraKelly Humes, PA-C  Omega-3 Fatty Acids (FISH OIL PO) Take 1 capsule by mouth daily.    Historical Provider, MD  ondansetron (ZOFRAN) 4 MG tablet Take 1 tablet (4 mg total) by mouth every 8 (eight) hours as needed for nausea or vomiting. 07/10/14   Mathis FareJennifer Lee H Presson, PA  Phenylephrine-DM-GG-APAP (TYLENOL COLD/FLU SEVERE PO) Take by mouth.    Historical Provider, MD   BP 130/92 mmHg  Pulse 67  Temp(Src) 98.6 F (37 C) (Oral)  Resp 18  SpO2 100% Physical Exam  Constitutional: He is oriented to person, place, and time. He appears well-developed and well-nourished.  HENT:  Head: Normocephalic and atraumatic.  Eyes: Conjunctivae and EOM are normal.  Neck: Normal range of motion. Neck supple.  Cardiovascular: Normal rate, regular rhythm and normal heart sounds.   Pulmonary/Chest: Effort normal and breath sounds normal. No respiratory distress.  Abdominal: He exhibits no distension. There is no tenderness. There is no rebound  and no guarding.  Musculoskeletal: Normal range of motion.  Neurological: He is alert and oriented to person, place, and time.  Skin: Skin is warm and dry.  Vitals reviewed.   ED Course  Procedures (including critical care time) Labs Review Labs Reviewed - No data to display  Imaging Review No results found.   EKG Interpretation None      MDM   Final diagnoses:  Nasal congestion    34 y.o. male with pertinent PMH of recent URI with steroid treatment for sore throat presents for  work note.  Symptoms improving after therapy. No fevers, patient has persistent but improving cough. On arrival today vitals signs and physical exam as above. Patient well-appearing. States his only reason is to get a work note so he can return to work. This was provided. Discharged home in stable condition..    I have reviewed all laboratory and imaging studies if ordered as above  1. Nasal congestion         Mirian Mo, MD 07/17/14 [phone]

## 2014-07-17 NOTE — Discharge Instructions (Signed)
Upper Respiratory Infection, Adult An upper respiratory infection (URI) is also sometimes known as the common cold. The upper respiratory tract includes the nose, sinuses, throat, trachea, and bronchi. Bronchi are the airways leading to the lungs. Most people improve within 1 week, but symptoms can last up to 2 weeks. A residual cough may last even longer.  CAUSES Many different viruses can infect the tissues lining the upper respiratory tract. The tissues become irritated and inflamed and often become very moist. Mucus production is also common. A cold is contagious. You can easily spread the virus to others by oral contact. This includes kissing, sharing a glass, coughing, or sneezing. Touching your mouth or nose and then touching a surface, which is then touched by another person, can also spread the virus. SYMPTOMS  Symptoms typically develop 1 to 3 days after you come in contact with a cold virus. Symptoms vary from person to person. They may include:  Runny nose.  Sneezing.  Nasal congestion.  Sinus irritation.  Sore throat.  Loss of voice (laryngitis).  Cough.  Fatigue.  Muscle aches.  Loss of appetite.  Headache.  Low-grade fever. DIAGNOSIS  You might diagnose your own cold based on familiar symptoms, since most people get a cold 2 to 3 times a year. Your caregiver can confirm this based on your exam. Most importantly, your caregiver can check that your symptoms are not due to another disease such as strep throat, sinusitis, pneumonia, asthma, or epiglottitis. Blood tests, throat tests, and X-rays are not necessary to diagnose a common cold, but they may sometimes be helpful in excluding other more serious diseases. Your caregiver will decide if any further tests are required. RISKS AND COMPLICATIONS  You may be at risk for a more severe case of the common cold if you smoke cigarettes, have chronic heart disease (such as heart failure) or lung disease (such as asthma), or if  you have a weakened immune system. The very young and very old are also at risk for more serious infections. Bacterial sinusitis, middle ear infections, and bacterial pneumonia can complicate the common cold. The common cold can worsen asthma and chronic obstructive pulmonary disease (COPD). Sometimes, these complications can require emergency medical care and may be life-threatening. PREVENTION  The best way to protect against getting a cold is to practice good hygiene. Avoid oral or hand contact with people with cold symptoms. Wash your hands often if contact occurs. There is no clear evidence that vitamin C, vitamin E, echinacea, or exercise reduces the chance of developing a cold. However, it is always recommended to get plenty of rest and practice good nutrition. TREATMENT  Treatment is directed at relieving symptoms. There is no cure. Antibiotics are not effective, because the infection is caused by a virus, not by bacteria. Treatment may include:  Increased fluid intake. Sports drinks offer valuable electrolytes, sugars, and fluids.  Breathing heated mist or steam (vaporizer or shower).  Eating chicken soup or other clear broths, and maintaining good nutrition.  Getting plenty of rest.  Using gargles or lozenges for comfort.  Controlling fevers with ibuprofen or acetaminophen as directed by your caregiver.  Increasing usage of your inhaler if you have asthma. Zinc gel and zinc lozenges, taken in the first 24 hours of the common cold, can shorten the duration and lessen the severity of symptoms. Pain medicines may help with fever, muscle aches, and throat pain. A variety of non-prescription medicines are available to treat congestion and runny nose. Your caregiver   can make recommendations and may suggest nasal or lung inhalers for other symptoms.  HOME CARE INSTRUCTIONS   Only take over-the-counter or prescription medicines for pain, discomfort, or fever as directed by your  caregiver.  Use a warm mist humidifier or inhale steam from a shower to increase air moisture. This may keep secretions moist and make it easier to breathe.  Drink enough water and fluids to keep your urine clear or pale yellow.  Rest as needed.  Return to work when your temperature has returned to normal or as your caregiver advises. You may need to stay home longer to avoid infecting others. You can also use a face mask and careful hand washing to prevent spread of the virus. SEEK MEDICAL CARE IF:   After the first few days, you feel you are getting worse rather than better.  You need your caregiver's advice about medicines to control symptoms.  You develop chills, worsening shortness of breath, or brown or red sputum. These may be signs of pneumonia.  You develop yellow or brown nasal discharge or pain in the face, especially when you bend forward. These may be signs of sinusitis.  You develop a fever, swollen neck glands, pain with swallowing, or white areas in the back of your throat. These may be signs of strep throat. SEEK IMMEDIATE MEDICAL CARE IF:   You have a fever.  You develop severe or persistent headache, ear pain, sinus pain, or chest pain.  You develop wheezing, a prolonged cough, cough up blood, or have a change in your usual mucus (if you have chronic lung disease).  You develop sore muscles or a stiff neck. Document Released: 11/03/2000 Document Revised: 08/02/2011 Document Reviewed: 08/15/2013 Exit[employer] Patient Information 2015 [employer]. This information is not intended to replace advice given to you by your health care provider. Make sure you discuss any questions you have with your health care provider.  

## 2014-07-17 NOTE — ED Notes (Addendum)
Pt from home c/o chest congestion and cough x 1 week. Was seen on 2/22 for same. He reports he wants to get "checked out and get a work note". Pt denies pain. He is alert, oriented, and ambulatory to acute room.

## 2014-07-18 LAB — CULTURE, GROUP A STREP

## 2014-09-02 ENCOUNTER — Emergency Department (HOSPITAL_COMMUNITY)
Admission: EM | Admit: 2014-09-02 | Discharge: 2014-09-03 | Disposition: A | Discharge status: Home / Self Care | Payer: Worker's Compensation | Attending: Emergency Medicine | Admitting: Emergency Medicine

## 2014-09-02 DIAGNOSIS — Z791 Long term (current) use of non-steroidal anti-inflammatories (NSAID): Secondary | ICD-10-CM | POA: Diagnosis not present

## 2014-09-02 DIAGNOSIS — J45909 Unspecified asthma, uncomplicated: Secondary | ICD-10-CM | POA: Diagnosis not present

## 2014-09-02 DIAGNOSIS — Z79899 Other long term (current) drug therapy: Secondary | ICD-10-CM | POA: Diagnosis not present

## 2014-09-02 DIAGNOSIS — X58XXXA Exposure to other specified factors, initial encounter: Secondary | ICD-10-CM | POA: Diagnosis not present

## 2014-09-02 DIAGNOSIS — Y9302 Activity, running: Secondary | ICD-10-CM | POA: Insufficient documentation

## 2014-09-02 DIAGNOSIS — Y9289 Other specified places as the place of occurrence of the external cause: Secondary | ICD-10-CM | POA: Insufficient documentation

## 2014-09-02 DIAGNOSIS — Y990 Civilian activity done for income or pay: Secondary | ICD-10-CM | POA: Diagnosis not present

## 2014-09-02 DIAGNOSIS — S8991XA Unspecified injury of right lower leg, initial encounter: Secondary | ICD-10-CM | POA: Diagnosis not present

## 2014-09-02 DIAGNOSIS — M25561 Pain in right knee: Secondary | ICD-10-CM

## 2014-09-03 ENCOUNTER — Emergency Department (HOSPITAL_COMMUNITY): Payer: Worker's Compensation

## 2014-09-03 ENCOUNTER — Encounter (HOSPITAL_COMMUNITY): Payer: Self-pay | Admitting: Emergency Medicine

## 2014-09-03 MED ORDER — IBUPROFEN 800 MG PO TABS
800.0000 mg | ORAL_TABLET | Freq: Once | ORAL | Status: AC
  Administered 2014-09-03: 800 mg via ORAL
  Filled 2014-09-03: qty 1

## 2014-09-03 MED ORDER — TRAMADOL HCL 50 MG PO TABS: 50.0000 mg | ORAL_TABLET | Freq: Four times a day (QID) | ORAL | Status: AC | PRN

## 2014-09-03 MED ORDER — DICLOFENAC SODIUM 1 % TD GEL: 4.0000 g | Freq: Four times a day (QID) | TRANSDERMAL | Status: AC

## 2014-09-03 NOTE — Discharge Instructions (Signed)
Arthralgia °Your caregiver has diagnosed you as suffering from an arthralgia. Arthralgia means there is pain in a joint. This can come from many reasons including: °· Bruising the joint which causes soreness (inflammation) in the joint. °· Wear and tear on the joints which occur as we grow older (osteoarthritis). °· Overusing the joint. °· Various forms of arthritis. °· Infections of the joint. °Regardless of the cause of pain in your joint, most of these different pains respond to anti-inflammatory drugs and rest. The exception to this is when a joint is infected, and these cases are treated with antibiotics, if it is a bacterial infection. °HOME CARE INSTRUCTIONS  °· Rest the injured area for as long as directed by your caregiver. Then slowly start using the joint as directed by your caregiver and as the pain allows. Crutches as directed may be useful if the ankles, knees or hips are involved. If the knee was splinted or casted, continue use and care as directed. If an stretchy or elastic wrapping bandage has been applied today, it should be removed and re-applied every 3 to 4 hours. It should not be applied tightly, but firmly enough to keep swelling down. Watch toes and feet for swelling, bluish discoloration, coldness, numbness or excessive pain. If any of these problems (symptoms) occur, remove the ace bandage and re-apply more loosely. If these symptoms persist, contact your caregiver or return to this location. °· For the first 24 hours, keep the injured extremity elevated on pillows while lying down. °· Apply ice for 15-20 minutes to the sore joint every couple hours while awake for the first half day. Then 03-04 times per day for the first 48 hours. Put the ice in a plastic bag and place a towel between the bag of ice and your skin. °· Wear any splinting, casting, elastic bandage applications, or slings as instructed. °· Only take over-the-counter or prescription medicines for pain, discomfort, or fever as  directed by your caregiver. Do not use aspirin immediately after the injury unless instructed by your physician. Aspirin can cause increased bleeding and bruising of the tissues. °· If you were given crutches, continue to use them as instructed and do not resume weight bearing on the sore joint until instructed. °Persistent pain and inability to use the sore joint as directed for more than 2 to 3 days are warning signs indicating that you should see a caregiver for a follow-up visit as soon as possible. Initially, a hairline fracture (break in bone) may not be evident on X-rays. Persistent pain and swelling indicate that further evaluation, non-weight bearing or use of the joint (use of crutches or slings as instructed), or further X-rays are indicated. X-rays may sometimes not show a small fracture until a week or 10 days later. Make a follow-up appointment with your own caregiver or one to whom we have referred you. A radiologist (specialist in reading X-rays) may read your X-rays. Make sure you know how you are to obtain your X-ray results. Do not assume everything is normal if you do not hear from us. °SEEK MEDICAL CARE IF: °Bruising, swelling, or pain increases. °SEEK IMMEDIATE MEDICAL CARE IF:  °· Your fingers or toes are numb or blue. °· The pain is not responding to medications and continues to stay the same or get worse. °· The pain in your joint becomes severe. °· You develop a fever over 102° F (38.9° C). °· It becomes impossible to move or use the joint. °MAKE SURE YOU:  °·   Understand these instructions. °· Will watch your condition. °· Will get help right away if you are not doing well or get worse. °Document Released: 05/10/2005 Document Revised: 08/02/2011 Document Reviewed: 12/27/2007 °[employer]® Patient Information ©2015 [employer]. This information is not intended to replace advice given to you by your health care provider. Make sure you discuss any questions you have with your health care  provider. ° °RICE: Routine Care for Injuries °The routine care of many injuries includes Rest, Ice, Compression, and Elevation (RICE). °HOME CARE INSTRUCTIONS °Rest is needed to allow your body to heal. Routine activities can usually be resumed when comfortable. Injured tendons and bones can take up to 6 weeks to heal. Tendons are the cord-like structures that attach muscle to bone. °Ice following an injury helps keep the swelling down and reduces pain. °Put ice in a plastic bag. °Place a towel between your skin and the bag. °Leave the ice on for 15-20 minutes, 3-4 times a day, or as directed by your health care provider. Do this while awake, for the first 24 to 48 hours. After that, continue as directed by your caregiver. °Compression helps keep swelling down. It also gives support and helps with discomfort. If an elastic bandage has been applied, it should be removed and reapplied every 3 to 4 hours. It should not be applied tightly, but firmly enough to keep swelling down. Watch fingers or toes for swelling, bluish discoloration, coldness, numbness, or excessive pain. If any of these problems occur, remove the bandage and reapply loosely. Contact your caregiver if these problems continue. °Elevation helps reduce swelling and decreases pain. With extremities, such as the arms, hands, legs, and feet, the injured area should be placed near or above the level of the heart, if possible. °SEEK IMMEDIATE MEDICAL CARE IF: °You have persistent pain and swelling. °You develop redness, numbness, or unexpected weakness. °Your symptoms are getting worse rather than improving after several days. °These symptoms may indicate that further evaluation or further X-rays are needed. Sometimes, X-rays may not show a small broken bone (fracture) until 1 week or 10 days later. Make a follow-up appointment with your caregiver. Ask when your X-ray results will be ready. Make sure you get your X-ray results. °Document Released: 08/22/2000  Document Revised: 05/15/2013 Document Reviewed: 10/09/2010 °[employer]® Patient Information ©2015 [employer]. This information is not intended to replace advice given to you by your health care provider. Make sure you discuss any questions you have with your health care provider. ° °

## 2014-09-03 NOTE — ED Notes (Addendum)
Patient c/o right knee pain, was injured at work. Patient states he was running, and felt his knee twist and pop. Patient has been able to ambulate on the leg, states the pain is worse when extended. 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 Patient states when he went home the knee began to swell. Patient took 4- (200mg ) ibuprofen at 1900 and continued using ice at home. Hx of ACL repair to the right @ 10 years ago.

## 2014-09-03 NOTE — ED Notes (Signed)
Awake. Verbally responsive. A/O x4. Resp even and unlabored. No audible adventitious breath sounds noted. ABC's intact.  

## 2014-09-03 NOTE — ED Notes (Addendum)
Awake. Verbally responsive. A/O x4. Resp even and unlabored. No audible adventitious breath sounds noted. ABC's intact. Pt reported rt knee pain s/p to chasing someone and twisting foot and hearing rt knee "pop". (+)PMS, CRT brisk. LROM. Able to apply some pressure with weightbearing. Currently waiting on x-ray results.

## 2014-09-03 NOTE — ED Notes (Signed)
Pt refused crutches. Reported that he has crutches at home and plans to use them.

## 2014-09-03 NOTE — ED Provider Notes (Signed)
CSN: [id number]     Arrival date & time 09/02/14  2354 History   First MD Initiated Contact with Patient 09/03/14 0103     Chief Complaint  Patient presents with  . Knee Pain    right    (Consider location/radiation/quality/duration/timing/severity/associated sxs/prior Treatment) Patient is a 34 y.o. male presenting with knee pain. The history is provided by the patient. No language interpreter was used.  Knee Pain Location:  Knee Time since incident:  8 hours Injury: yes   Mechanism of injury comment:  Running and heard "pop" in knee Knee location:  R knee Pain details:    Quality:  Aching, sharp and throbbing   Radiates to:  Does not radiate   Severity:  Moderate   Onset quality:  Sudden   Duration:  8 hours   Timing:  Constant   Progression:  Waxing and waning Chronicity:  New Prior injury to area:  Yes (Prior right MCL and ACL tear; s/p surgical repair) Relieved by:  Nothing Worsened by:  Bearing weight Ineffective treatments:  NSAIDs Associated symptoms: swelling   Associated symptoms: no decreased ROM, no fever, no muscle weakness, no numbness and no tingling     Past Medical History  Diagnosis Date  . Asthma     as a child   Past Surgical History  Procedure Laterality Date  . Anterior cruciate ligament repair Right 2002  . Foot surgery Right 01/2014    laceration between great toe and 2nd toe   Family History  Problem Relation Age of Onset  . Heart attack Father    History  Substance Use Topics  . Smoking status: Never Smoker   . Smokeless tobacco: Not on file  . Alcohol Use: No    Review of Systems  Constitutional: Negative for fever.  Musculoskeletal: Positive for joint swelling and arthralgias.  All other systems reviewed and are negative.   Allergies  Morphine and related  Home Medications   Prior to Admission medications   Medication Sig Start Date End Date Taking? Authorizing Provider  azithromycin (ZITHROMAX Z-PAK) 250 MG tablet Take  as directed on pack Patient not taking: Reported on 04/30/2014 04/02/14   Linna Hoff, MD  cetirizine-pseudoephedrine (ZYRTEC-D) 5-120 MG per tablet Take 1 tablet by mouth 2 (two) times daily. Patient not taking: Reported on 07/15/2014 04/30/14   Antony Madura, PA-C  diclofenac sodium (VOLTAREN) 1 % GEL Apply 4 g topically 4 (four) times daily. 09/03/14   Antony Madura, PA-C  diphenhydrAMINE (BENADRYL) 25 mg capsule Take 25 mg by mouth every 6 (six) hours as needed for allergies.    Historical Provider, MD  ibuprofen (ADVIL,MOTRIN) 600 MG tablet Take 1 tablet (600 mg total) by mouth every 6 (six) hours as needed. 07/15/14   Derwood Kaplan, MD  ipratropium (ATROVENT) 0.06 % nasal spray Place 2 sprays into both nostrils 4 (four) times daily. 07/04/14   Linna Hoff, MD  Multiple Vitamin (MULTIVITAMIN WITH MINERALS) TABS tablet Take 1 tablet by mouth daily.    Historical Provider, MD  naproxen (NAPROSYN) 500 MG tablet Take 1 tablet (500 mg total) by mouth 2 (two) times daily. Patient not taking: Reported on 07/15/2014 04/30/14   Antony Madura, PA-C  Omega-3 Fatty Acids (FISH OIL PO) Take 1 capsule by mouth daily.    Historical Provider, MD  ondansetron (ZOFRAN) 4 MG tablet Take 1 tablet (4 mg total) by mouth every 8 (eight) hours as needed for nausea or vomiting. 07/10/14   Mathis Fare  Presson, PA  Phenylephrine-DM-GG-APAP (TYLENOL COLD/FLU SEVERE PO) Take by mouth.    Historical Provider, MD  traMADol (ULTRAM) 50 MG tablet Take 1 tablet (50 mg total) by mouth every 6 (six) hours as needed. 09/03/14   Antony Madura, PA-C   BP 132/98 mmHg  Pulse 83  Temp(Src) 98.3 F (36.8 C) (Oral)  Resp 16  SpO2 99%   Physical Exam  Constitutional: He is oriented to person, place, and time. He appears well-developed and well-nourished. No distress.  HENT:  Head: Normocephalic and atraumatic.  Eyes: Conjunctivae and EOM are normal. No scleral icterus.  Neck: Normal range of motion.  Cardiovascular: Normal rate, regular  rhythm and intact distal pulses.   DP and PT pulses 2+ in the right lower extremity  Pulmonary/Chest: Effort normal. No respiratory distress.  Musculoskeletal: Normal range of motion. He exhibits tenderness.       Right knee: He exhibits swelling (mild). He exhibits normal range of motion, no effusion, no deformity, no erythema, normal alignment, no LCL laxity, normal patellar mobility and no MCL laxity. Tenderness found. Medial joint line tenderness noted.  Tenderness to palpation along the medial joint line with mild swelling. Normal range of motion. No crepitus or deformity. No erythema or heat to touch. Well-healed surgical scar through the midline of the anterior right knee.  Neurological: He is alert and oriented to person, place, and time. He exhibits normal muscle tone. Coordination normal.  Sensation to light touch intact. Patellar and Achilles reflexes normal. Strength 5/5 against resistance with knee flexion and extension.  Skin: Skin is warm and dry. No rash noted. He is not diaphoretic. No erythema. No pallor.  Psychiatric: He has a normal mood and affect. His behavior is normal.  Nursing note and vitals reviewed.   ED Course  Procedures (including critical care time) Labs Review Labs Reviewed - No data to display  Imaging Review Dg Knee Complete 4 Views Right  09/03/2014   CLINICAL DATA:  Right knee pain. Running at his job 1 day prior at 5:30 p.m., twisted knee and felt a pop. Now with knee swelling and pain.  EXAM: RIGHT KNEE - COMPLETE 4+ VIEW  COMPARISON:  None.  FINDINGS: No fracture or dislocation. The alignment and joint spaces are maintained. Prior ACL repair with surgical anchors. There are small tricompartmental osteophytes. 6 mm ossification projecting in the patellofemoral articulation on the lateral view appears chronic. There is a small joint effusion.  IMPRESSION: 1. Small joint effusion. 2. No acute bony abnormality.  Post ACL repair.   Electronically Signed   By:  Rubye Oaks M.D.   On: 09/03/2014 01:42     EKG Interpretation None      MDM   Final diagnoses:  Medial knee pain, right    34 year old male presents to the emergency department for further evaluation of right knee pain with sudden onset while running. Patient reports hearing a "pop". Patient is neurovascularly intact. He has good range of motion to his right knee without deformity or crepitus. Tenderness along the medial joint line suspect for medial meniscal injury. Patient does have a history of prior MCL tear, though little tenderness to palpation extends superior or inferior from the medial joint line. X-ray negative for fracture or bony deformity. There is a small joint effusion.  Patient given knee sleeve in ED for stability. Have advised crutches for WBAT. RICE and NSAIDs advised as well as Tramadol PRN for severe pain and light duty at work for 1 week. Patient  pre[facility] Have advised f/u with this practice within the week for further evaluation. Return precautions discussed and provided. Patient agreeable to plan with no unaddressed concerns.   Filed Vitals:   09/03/14 0003 09/03/14 0237  BP: 132/98   Pulse: 83 65  Temp: 98.3 F (36.8 C)   TempSrc: Oral   Resp: 16 18  SpO2: 99% 100%     Antony MaduraKelly Fin Hupp, PA-C 09/03/14 0247  Elwin MochaBlair Walden, MD 09/04/14 [phone]

## 2015-05-30 ENCOUNTER — Encounter (HOSPITAL_COMMUNITY): Payer: Self-pay | Admitting: Emergency Medicine

## 2015-05-30 ENCOUNTER — Emergency Department (HOSPITAL_COMMUNITY)
Admission: EM | Admit: 2015-05-30 | Discharge: 2015-05-30 | Disposition: A | Discharge status: Home / Self Care | Payer: BC Managed Care – PPO | Source: Home / Self Care | Attending: Family Medicine | Admitting: Family Medicine

## 2015-05-30 DIAGNOSIS — J019 Acute sinusitis, unspecified: Secondary | ICD-10-CM | POA: Diagnosis not present

## 2015-05-30 DIAGNOSIS — J40 Bronchitis, not specified as acute or chronic: Secondary | ICD-10-CM | POA: Diagnosis not present

## 2015-05-30 MED ORDER — AMOXICILLIN 500 MG PO CAPS: 500.0000 mg | ORAL_CAPSULE | Freq: Three times a day (TID) | ORAL | Status: AC

## 2015-05-30 NOTE — ED Notes (Signed)
C/o cold sx onset Tuesday associated w/diarrhea, fevers, HA, cough, and congestion A&O x4.. No acute distress.

## 2015-05-30 NOTE — ED Provider Notes (Signed)
CSN: [id number]     Arrival date & time 05/30/15  1853 History   First MD Initiated Contact with Patient 05/30/15 1927     No chief complaint on file.  (Consider location/radiation/quality/duration/timing/severity/associated sxs/prior Treatment) The history is provided by the patient. No language interpreter was used.   History obtained from patient:   LOCATION:head SEVERITY:4 DURATION:5 days CONTEXT:onset after  Onset of diarrhea with son and wife.  QUALITY: MODIFYING FACTORS:nyquil, tylenol ASSOCIATED SYMPTOMS:fever 100.1 TIMING:constant OCCUPATION:corrections  Past Medical History  Diagnosis Date  . Asthma     as a child   Past Surgical History  Procedure Laterality Date  . Anterior cruciate ligament repair Right 2002  . Foot surgery Right 01/2014    laceration between great toe and 2nd toe   Family History  Problem Relation Age of Onset  . Heart attack Father    Social History  Substance Use Topics  . Smoking status: Never Smoker   . Smokeless tobacco: Not on file  . Alcohol Use: No    Review of Systems  Neurological: Positive for headaches.    Allergies  Morphine and related  Home Medications   Prior to Admission medications   Medication Sig Start Date End Date Taking? Authorizing Provider  azithromycin (ZITHROMAX Z-PAK) 250 MG tablet Take as directed on pack Patient not taking: Reported on 04/30/2014 04/02/14   Linna HoffJames D Kindl, MD  cetirizine-pseudoephedrine (ZYRTEC-D) 5-120 MG per tablet Take 1 tablet by mouth 2 (two) times daily. Patient not taking: Reported on 07/15/2014 04/30/14   Antony MaduraKelly Humes, PA-C  diclofenac sodium (VOLTAREN) 1 % GEL Apply 4 g topically 4 (four) times daily. 09/03/14   Antony MaduraKelly Humes, PA-C  diphenhydrAMINE (BENADRYL) 25 mg capsule Take 25 mg by mouth every 6 (six) hours as needed for allergies.    Historical Provider, MD  ibuprofen (ADVIL,MOTRIN) 600 MG tablet Take 1 tablet (600 mg total) by mouth every 6 (six) hours as needed. 07/15/14    Derwood KaplanAnkit Nanavati, MD  ipratropium (ATROVENT) 0.06 % nasal spray Place 2 sprays into both nostrils 4 (four) times daily. 07/04/14   Linna HoffJames D Kindl, MD  Multiple Vitamin (MULTIVITAMIN WITH MINERALS) TABS tablet Take 1 tablet by mouth daily.    Historical Provider, MD  naproxen (NAPROSYN) 500 MG tablet Take 1 tablet (500 mg total) by mouth 2 (two) times daily. Patient not taking: Reported on 07/15/2014 04/30/14   Antony MaduraKelly Humes, PA-C  Omega-3 Fatty Acids (FISH OIL PO) Take 1 capsule by mouth daily.    Historical Provider, MD  ondansetron (ZOFRAN) 4 MG tablet Take 1 tablet (4 mg total) by mouth every 8 (eight) hours as needed for nausea or vomiting. 07/10/14   Mathis FareJennifer Lee H Presson, PA  Phenylephrine-DM-GG-APAP (TYLENOL COLD/FLU SEVERE PO) Take by mouth.    Historical Provider, MD  traMADol (ULTRAM) 50 MG tablet Take 1 tablet (50 mg total) by mouth every 6 (six) hours as needed. 09/03/14   Antony MaduraKelly Humes, PA-C   Meds Ordered and Administered this Visit  Medications - No data to display  There were no vitals taken for this visit. No data found.   Physical Exam  Constitutional: He is oriented to person, place, and time. He appears well-developed and well-nourished. No distress.  HENT:  Head: Normocephalic and atraumatic.  Right Ear: External ear normal.  Left Ear: External ear normal.  Mouth/Throat: Oropharynx is clear and moist.  Eyes: Conjunctivae are normal.  Neck: Normal range of motion. Neck supple.  Cardiovascular: Normal rate.   Pulmonary/Chest:  Effort normal and breath sounds normal.  Abdominal: Soft.  Musculoskeletal: Normal range of motion.  Neurological: He is alert and oriented to person, place, and time.  Skin: Skin is warm and dry.  Psychiatric: He has a normal mood and affect. His behavior is normal. Judgment and thought content normal.    ED Course  Procedures (including critical care time)  Labs Review Labs Reviewed - No data to display  Imaging Review No results  found.   Visual Acuity Review  Right Eye Distance:   Left Eye Distance:   Bilateral Distance:    Right Eye Near:   Left Eye Near:    Bilateral Near:         MDM   1. Acute sinusitis, recurrence not specified, unspecified location   2. Bronchitis    Patient is advised to continue home symptomatic treatment. Prescription for amoxil  sent pharmacy patient has indicated. Patient is advised that if there are new or worsening symptoms or attend the emergency department, or contact primary care provider. Instructions of care provided discharged home in stable condition. Return to work note provided.   THIS NOTE WAS GENERATED USING A VOICE RECOGNITION SOFTWARE PROGRAM. ALL REASONABLE EFFORTS  WERE MADE TO PROOFREAD THIS DOCUMENT FOR ACCURACY.     Tharon Aquas, PA 05/30/15 2019

## 2015-05-30 NOTE — Discharge Instructions (Signed)
Sinus Rinse WHAT IS A SINUS RINSE? A sinus rinse is a simple home treatment that is used to rinse your sinuses with a sterile mixture of salt and water (saline solution). Sinuses are air-filled spaces in your skull behind the bones of your face and forehead that open into your nasal cavity. You will use the following:  Saline solution.  Neti pot or spray bottle. This releases the saline solution into your nose and through your sinuses. Neti pots and spray bottles can be purchased at [facility] local [facility], a health food store, or online. WHEN WOULD I DO A SINUS RINSE? A sinus rinse can help to clear mucus, dirt, dust, or pollen from the nasal cavity. You may do a sinus rinse when you have a cold, a virus, nasal allergy symptoms, a sinus infection, or stuffiness in the nose or sinuses. If you are considering a sinus rinse:  Ask your child's health care provider before performing a sinus rinse on your child.  Do not do a sinus rinse if you have had ear or nasal surgery, ear infection, or blocked ears. HOW DO I DO A SINUS RINSE?  Wash your hands.  Disinfect your device according to the directions provided and then dry it.  Use the solution that comes with your device or one that is sold separately in stores. Follow the mixing directions on the package.  Fill your device with the amount of saline solution as directed by the device instructions.  Stand over a sink and tilt your head sideways over the sink.  Place the spout of the device in your upper nostril (the one closer to the ceiling).  Gently pour or squeeze the saline solution into the nasal cavity. The liquid should drain to the lower nostril if you are not overly congested.  Gently blow your nose. Blowing too hard may cause ear pain.  Repeat in the other nostril.  Clean and rinse your device with clean water and then air-dry it. ARE THERE RISKS OF A SINUS RINSE?  Sinus rinse is generally very safe and effective. However, there  are a few risks, which include:   A burning sensation in the sinuses. This may happen if you do not make the saline solution as directed. Make sure to follow all directions when making the saline solution.  Infection from contaminated water. This is rare, but possible.  Nasal irritation.   This information is not intended to replace advice given to you by your health care provider. Make sure you discuss any questions you have with your health care provider.   Document Released: 12/05/2013 Document Reviewed: 12/05/2013 Elsevier Interactive Patient Education 2016 [employer] Sinusitis, Adult Sinusitis is redness, soreness, and puffiness (inflammation) of the air pockets in the bones of your face (sinuses). The redness, soreness, and puffiness can cause air and mucus to get trapped in your sinuses. This can allow germs to grow and cause an infection.  HOME CARE   Drink enough fluids to keep your pee (urine) clear or pale yellow.  Use a humidifier in your home.  Run a hot shower to create steam in the bathroom. Sit in the bathroom with the door closed. Breathe in the steam 3-4 times a day.  Put a warm, moist washcloth on your face 3-4 times a day, or as told by your doctor.  Use salt water sprays (saline sprays) to wet the thick fluid in your nose. This can help the sinuses drain.  Only take medicine as told by your  doctor. GET HELP RIGHT AWAY IF:   Your pain gets worse.  You have very bad headaches.  You are sick to your stomach (nauseous).  You throw up (vomit).  You are very sleepy (drowsy) all the time.  Your face is puffy (swollen).  Your vision changes.  You have a stiff neck.  You have trouble breathing. MAKE SURE YOU:   Understand these instructions.  Will watch your condition.  Will get help right away if you are not doing well or get worse.   This information is not intended to replace advice given to you by your health care provider. Make sure you  discuss any questions you have with your health care provider.   Document Released: 10/27/2007 Document Revised: 05/31/2014 Document Reviewed: 12/14/2011 Elsevier Interactive Patient Education 2016 Elsevier Inc. Acute Bronchitis Bronchitis is when the airways that extend from the windpipe into the lungs get red, puffy, and painful (inflamed). Bronchitis often causes thick spit (mucus) to develop. This leads to a cough. A cough is the most common symptom of bronchitis. In acute bronchitis, the condition usually begins suddenly and goes away over time (usually in 2 weeks). Smoking, allergies, and asthma can make bronchitis worse. Repeated episodes of bronchitis may cause more lung problems. HOME CARE  Rest.  Drink enough fluids to keep your pee (urine) clear or pale yellow (unless you need to limit fluids as told by your doctor).  Only take over-the-counter or prescription medicines as told by your doctor.  Avoid smoking and secondhand smoke. These can make bronchitis worse. If you are a smoker, think about using nicotine gum or skin patches. Quitting smoking will help your lungs heal faster.  Reduce the chance of getting bronchitis again by:  Washing your hands often.  Avoiding people with cold symptoms.  Trying not to touch your hands to your mouth, nose, or eyes.  Follow up with your doctor as told. GET HELP IF: Your symptoms do not improve after 1 week of treatment. Symptoms include:  Cough.  Fever.  Coughing up thick spit.  Body aches.  Chest congestion.  Chills.  Shortness of breath.  Sore throat. GET HELP RIGHT AWAY IF:   You have an increased fever.  You have chills.  You have severe shortness of breath.  You have bloody thick spit (sputum).  You throw up (vomit) often.  You lose too much body fluid (dehydration).  You have a severe headache.  You faint. MAKE SURE YOU:   Understand these instructions.  Will watch your condition.  Will get help  right away if you are not doing well or get worse.   This information is not intended to replace advice given to you by your health care provider. Make sure you discuss any questions you have with your health care provider.   Document Released: 10/27/2007 Document Revised: 01/10/2013 Document Reviewed: 10/31/2012 Elsevier Interactive Patient Education [employer]

## 2015-08-09 ENCOUNTER — Encounter (HOSPITAL_COMMUNITY): Payer: Self-pay | Admitting: Emergency Medicine

## 2015-08-09 ENCOUNTER — Emergency Department (HOSPITAL_COMMUNITY)
Admission: EM | Admit: 2015-08-09 | Discharge: 2015-08-09 | Disposition: A | Discharge status: Home / Self Care | Payer: BC Managed Care – PPO | Source: Home / Self Care | Attending: Emergency Medicine | Admitting: Emergency Medicine

## 2015-08-09 DIAGNOSIS — J018 Other acute sinusitis: Secondary | ICD-10-CM | POA: Diagnosis not present

## 2015-08-09 MED ORDER — AZITHROMYCIN 250 MG PO TABS
ORAL_TABLET | ORAL | Status: AC
Start: 2015-08-09 — End: ?

## 2015-08-09 MED ORDER — PREDNISONE 50 MG PO TABS
ORAL_TABLET | ORAL | Status: AC
Start: 2015-08-09 — End: ?

## 2015-08-09 NOTE — ED Notes (Signed)
Pt reports he is getting over the cold/flu onset Tuesday but states he is still having ST and facial pressure/HA Fevers, BA and fatigue have subsided A&O x4... No acute distress.

## 2015-08-09 NOTE — Discharge Instructions (Signed)
Your sinus pressure and headache is likely coming from post flu inflammation and allergies. Take prednisone daily for 5 days. Take an over-the-counter allergy medicine, such as Zyrtec, Allegra, Claritin, daily for the next week. If your symptoms are not improving by Tuesday, please fill the prescription for azithromycin, and antibiotic. You are okay to return to work tonight.

## 2015-08-09 NOTE — ED Provider Notes (Signed)
CSN: [id number]     Arrival date & time 08/09/15  1715 History   First MD Initiated Contact with Patient 08/09/15 1817     Chief Complaint  Patient presents with  . URI   (Consider location/radiation/quality/duration/timing/severity/associated sxs/prior Treatment) HPI He is a 35 year old man here for evaluation of sinus issues. He states he had the flu earlier this week. He reports fevers, sore throat, and cough. He states the fever has been gone for the last 2 days. He continues to have sinus pressure, postnasal drainage, and a sore throat. No cough or shortness of breath. He has been taking NyQuil without much improvement.  Past Medical History  Diagnosis Date  . Asthma     as a child   Past Surgical History  Procedure Laterality Date  . Anterior cruciate ligament repair Right 2002  . Foot surgery Right 01/2014    laceration between great toe and 2nd toe   Family History  Problem Relation Age of Onset  . Heart attack Father    Social History  Substance Use Topics  . Smoking status: Never Smoker   . Smokeless tobacco: None  . Alcohol Use: No    Review of Systems As in history of present illness Allergies  Morphine and related  Home Medications   Prior to Admission medications   Medication Sig Start Date End Date Taking? Authorizing Provider  amoxicillin (AMOXIL) 500 MG capsule Take 1 capsule (500 mg total) by mouth 3 (three) times daily. 05/30/15   Tharon Aquas, PA  azithromycin (ZITHROMAX Z-PAK) 250 MG tablet Take 2 pills today, then 1 pill daily until gone. 08/09/15   Charm Rings, MD  diclofenac sodium (VOLTAREN) 1 % GEL Apply 4 g topically 4 (four) times daily. 09/03/14   Antony Madura, PA-C  diphenhydrAMINE (BENADRYL) 25 mg capsule Take 25 mg by mouth every 6 (six) hours as needed for allergies.    Historical Provider, MD  ibuprofen (ADVIL,MOTRIN) 600 MG tablet Take 1 tablet (600 mg total) by mouth every 6 (six) hours as needed. 07/15/14   Derwood Kaplan, MD   ipratropium (ATROVENT) 0.06 % nasal spray Place 2 sprays into both nostrils 4 (four) times daily. 07/04/14   Linna Hoff, MD  Multiple Vitamin (MULTIVITAMIN WITH MINERALS) TABS tablet Take 1 tablet by mouth daily.    Historical Provider, MD  Omega-3 Fatty Acids (FISH OIL PO) Take 1 capsule by mouth daily.    Historical Provider, MD  ondansetron (ZOFRAN) 4 MG tablet Take 1 tablet (4 mg total) by mouth every 8 (eight) hours as needed for nausea or vomiting. 07/10/14   Mathis Fare Presson, PA  Phenylephrine-DM-GG-APAP (TYLENOL COLD/FLU SEVERE PO) Take by mouth.    Historical Provider, MD  predniSONE (DELTASONE) 50 MG tablet Take 1 pill daily for 5 days. 08/09/15   Charm Rings, MD  traMADol (ULTRAM) 50 MG tablet Take 1 tablet (50 mg total) by mouth every 6 (six) hours as needed. 09/03/14   Antony Madura, PA-C   Meds Ordered and Administered this Visit  Medications - No data to display  BP 141/96 mmHg  Pulse 64  Temp(Src) 98.4 F (36.9 C) (Oral)  Resp 16  SpO2 100% No data found.   Physical Exam  Constitutional: He is oriented to person, place, and time. He appears well-developed and well-nourished. No distress.  HENT:  Mouth/Throat: No oropharyngeal exudate.  Nasal mucosa is slightly inflamed. Small amount of clear postnasal drainage. He does have some sinus tenderness.  Neck: Neck supple.  Cardiovascular: Normal rate.   No murmur heard. Pulmonary/Chest: Effort normal and breath sounds normal. No respiratory distress. He has no wheezes. He has no rales.  Lymphadenopathy:    He has no cervical adenopathy.  Neurological: He is alert and oriented to person, place, and time.    ED Course  Procedures (including critical care time)  Labs Review Labs Reviewed - No data to display  Imaging Review No results found.   MDM   1. Other acute sinusitis    I suspect this is post flu inflammation versus allergies. Treat with prednisone and OTC allergy medicine. Prescription given for  azithromycin to be filled if this does not improve by Tuesday. Work note given. Follow-up as needed.    Charm RingsErin J Aaliya Maultsby, MD 08/09/15 [phone]

## 2016-07-29 IMAGING — CR DG KNEE COMPLETE 4+V*R*
4 series · 4 of 4 positions shown · non-contrast
Comparison: None.

CLINICAL DATA: Right knee pain. Running at his job 1 day prior at
[DATE] p.m., twisted knee and felt a pop. Now with knee swelling and
pain.

EXAM:
RIGHT KNEE - COMPLETE 4+ VIEW

[w knee ap right (1 of 2)]
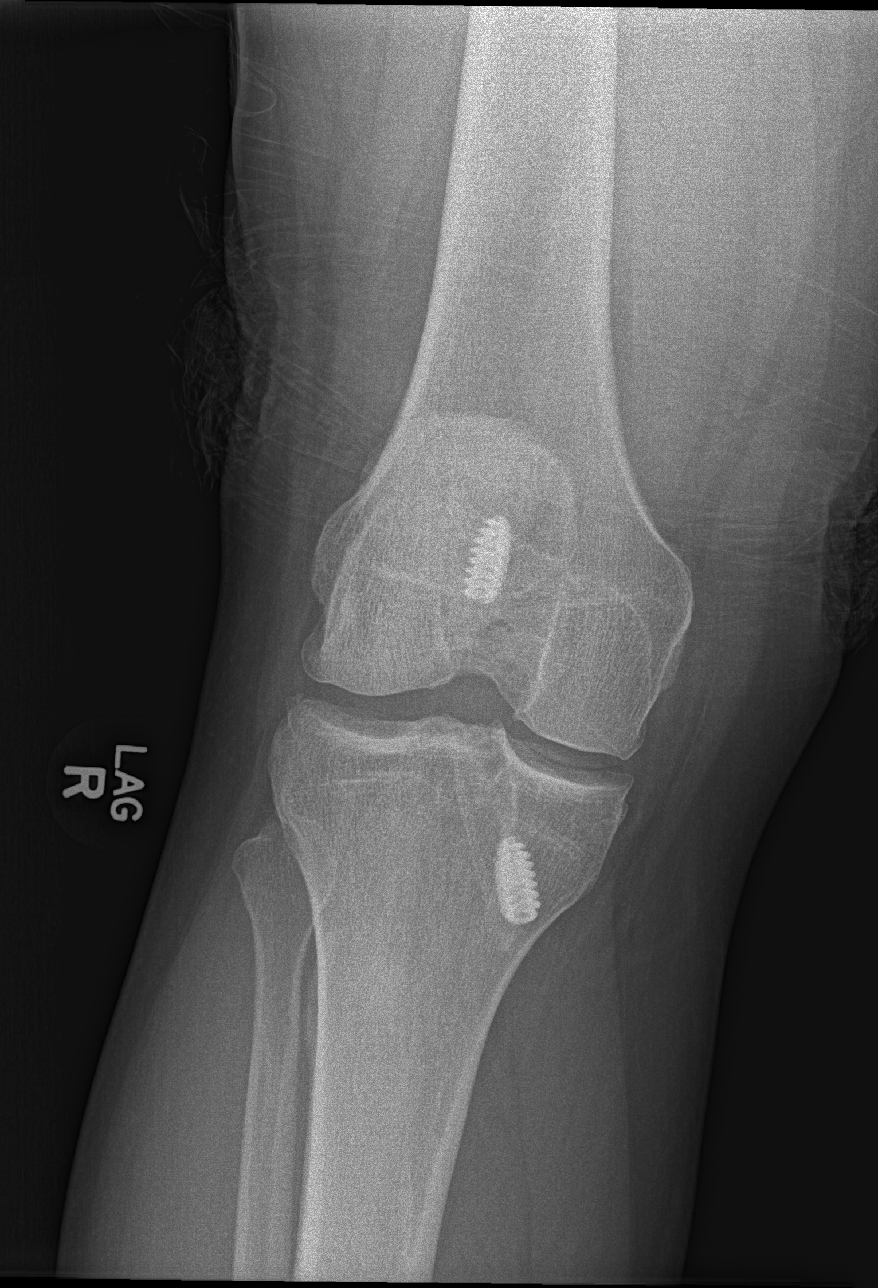

[w knee ap right (2 of 2)]
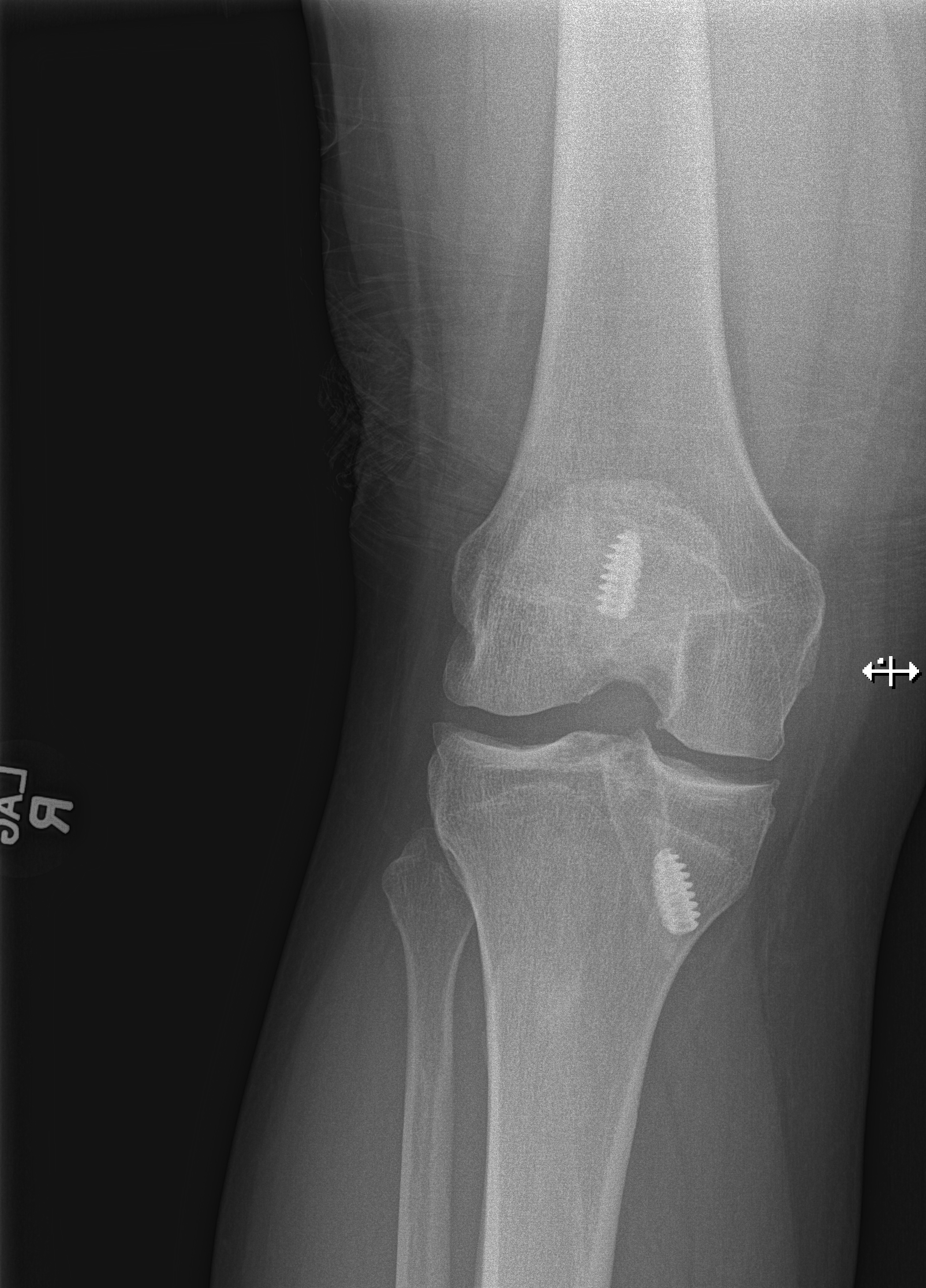

[t knee lat right]
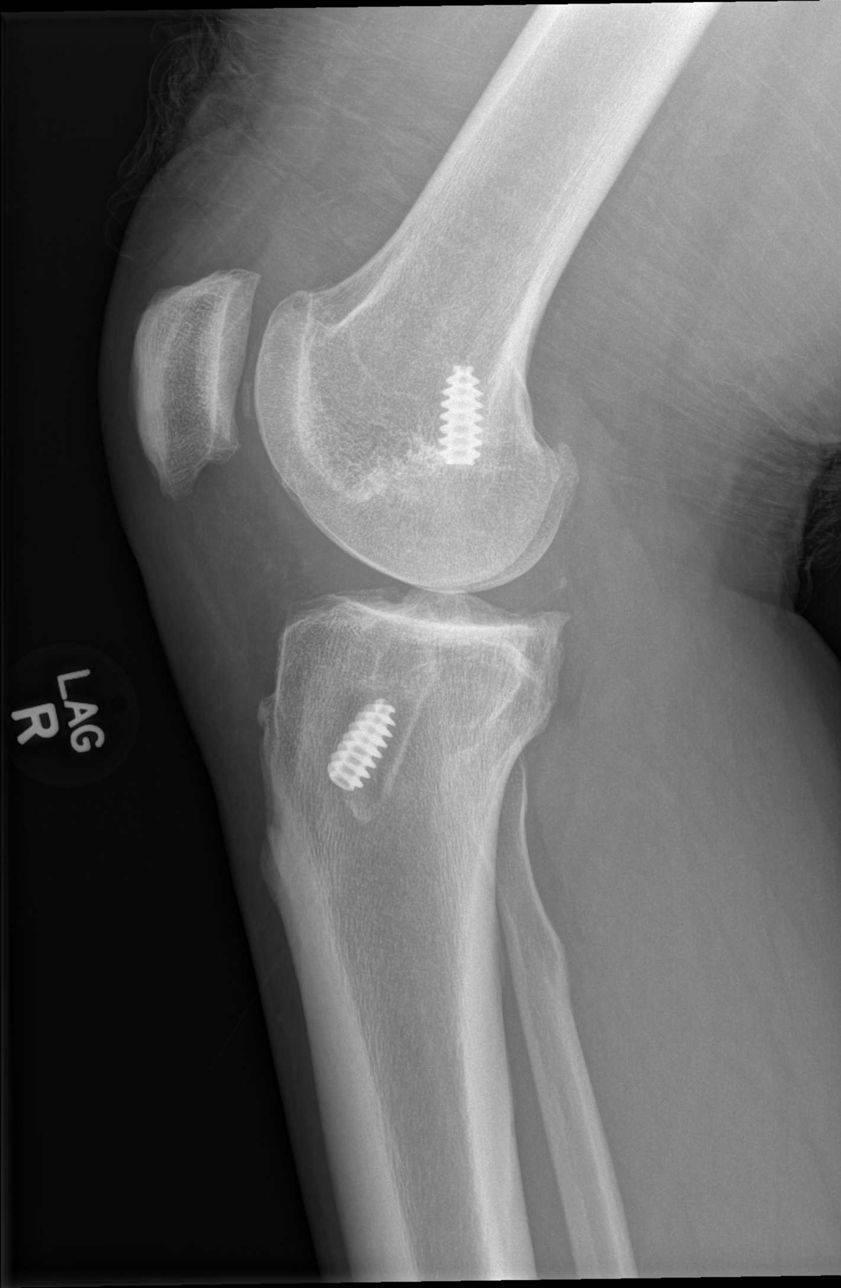

[x knee ap right]
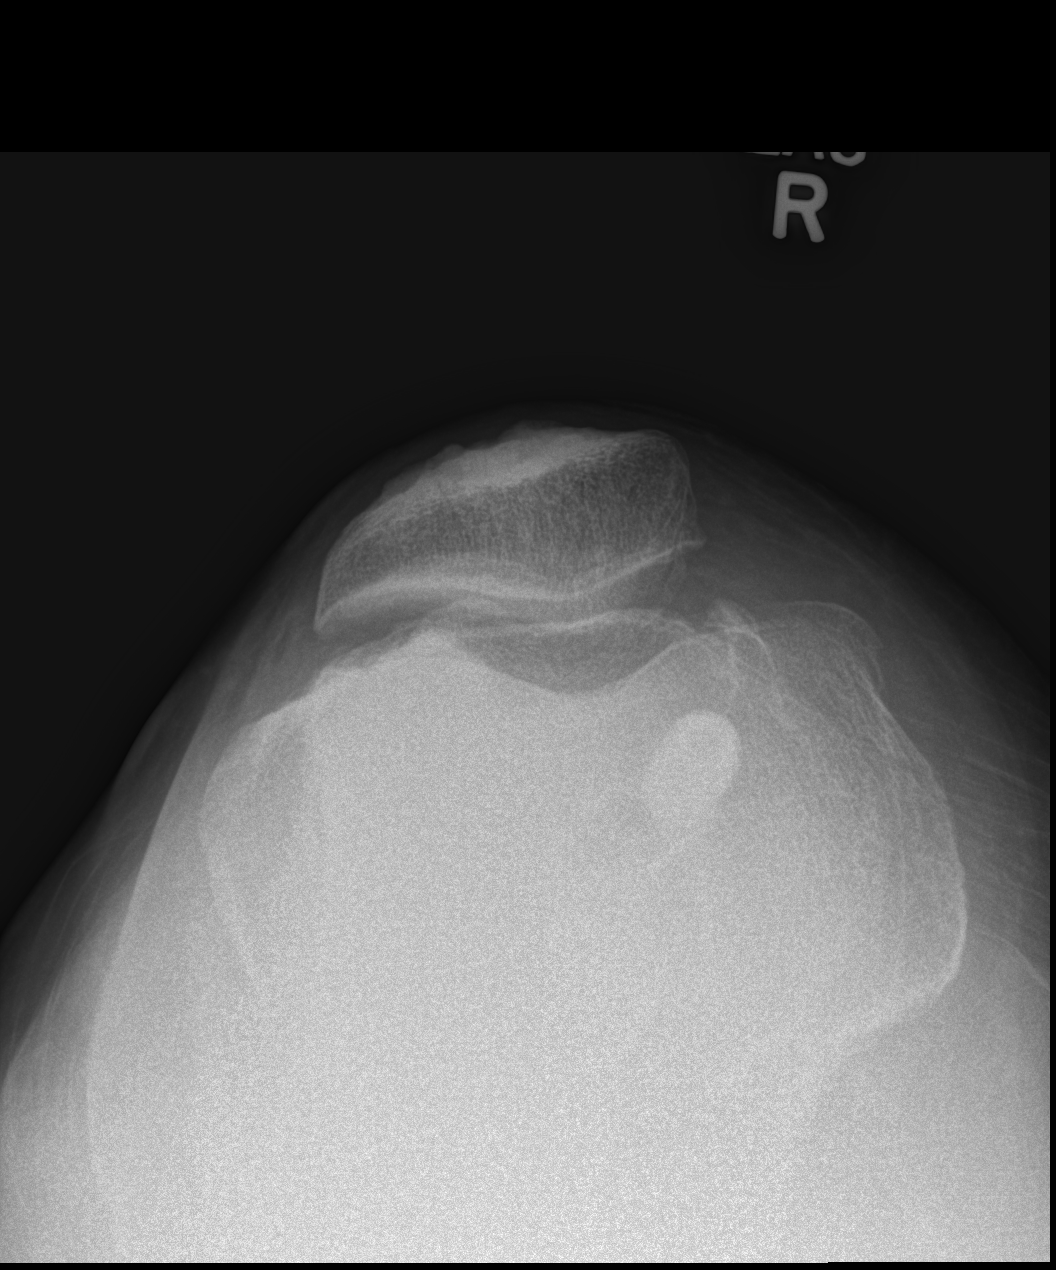

[4 of 4 positions shown; findings below may reference images not displayed]

FINDINGS: No fracture or dislocation. The alignment and joint spaces are
maintained. Prior ACL repair with surgical anchors. There are small
tricompartmental osteophytes. 6 mm ossification projecting in the
patellofemoral articulation on the lateral view appears chronic.
There is a small joint effusion.
IMPRESSION: 1. Small joint effusion.
2. No acute bony abnormality.  Post ACL repair.

## 2017-02-01 ENCOUNTER — Encounter (HOSPITAL_COMMUNITY): Payer: Self-pay | Admitting: *Deleted

## 2017-02-01 ENCOUNTER — Emergency Department (HOSPITAL_COMMUNITY)
Admission: EM | Admit: 2017-02-01 | Discharge: 2017-02-01 | Disposition: A | Discharge status: Home / Self Care | Payer: BC Managed Care – PPO | Attending: Emergency Medicine | Admitting: Emergency Medicine

## 2017-02-01 DIAGNOSIS — Z5321 Procedure and treatment not carried out due to patient leaving prior to being seen by health care provider: Secondary | ICD-10-CM | POA: Insufficient documentation

## 2017-02-01 DIAGNOSIS — R1084 Generalized abdominal pain: Secondary | ICD-10-CM | POA: Diagnosis not present

## 2017-02-01 NOTE — ED Notes (Signed)
Pt refused blood work  

## 2017-02-01 NOTE — ED Notes (Signed)
Pt reports since he feels better he will follow up with PCP; refused blood work and xray

## 2017-02-01 NOTE — ED Triage Notes (Signed)
Pt arrived by EMS c/o chest and abdominal pain onset tonight. Pt had two cups of coffee then went to work out. Became diaphoretic with severe epigastric pain. Reports feeling better after having diarrhea and vomiting. EMS gave  ASA. Denies pain at present
